# Patient Record
Sex: Female | Born: 1960 | Race: White | Hispanic: No | Marital: Married | Smoking: Former smoker
Health system: Southern US, Community
[De-identification: ages and names within clinical notes are randomized; demographics above are authoritative.]

## PROBLEM LIST (undated history)

## (undated) DIAGNOSIS — E213 Hyperparathyroidism, unspecified: Secondary | ICD-10-CM

## (undated) DIAGNOSIS — M720 Palmar fascial fibromatosis [Dupuytren]: Secondary | ICD-10-CM

## (undated) DIAGNOSIS — M1990 Unspecified osteoarthritis, unspecified site: Secondary | ICD-10-CM

## (undated) DIAGNOSIS — F329 Major depressive disorder, single episode, unspecified: Secondary | ICD-10-CM

## (undated) DIAGNOSIS — M722 Plantar fascial fibromatosis: Secondary | ICD-10-CM

## (undated) DIAGNOSIS — F419 Anxiety disorder, unspecified: Secondary | ICD-10-CM

## (undated) DIAGNOSIS — F32A Depression, unspecified: Secondary | ICD-10-CM

## (undated) DIAGNOSIS — K759 Inflammatory liver disease, unspecified: Secondary | ICD-10-CM

## (undated) HISTORY — DX: Plantar fascial fibromatosis: M72.2

## (undated) HISTORY — DX: Palmar fascial fibromatosis (dupuytren): M72.0

## (undated) HISTORY — PX: BACK SURGERY: SHX140

---

## 2003-03-09 ENCOUNTER — Other Ambulatory Visit: Admission: RE | Admit: 2003-03-09 | Discharge: 2003-03-09 | Payer: Self-pay | Admitting: *Deleted

## 2004-04-13 ENCOUNTER — Other Ambulatory Visit: Admission: RE | Admit: 2004-04-13 | Discharge: 2004-04-13 | Payer: Self-pay | Admitting: *Deleted

## 2005-01-08 ENCOUNTER — Emergency Department (HOSPITAL_COMMUNITY): Admission: EM | Admit: 2005-01-08 | Discharge: 2005-01-08 | Payer: Self-pay | Admitting: Emergency Medicine

## 2006-05-10 ENCOUNTER — Ambulatory Visit (HOSPITAL_COMMUNITY): Admission: RE | Admit: 2006-05-10 | Discharge: 2006-05-11 | Payer: Self-pay | Admitting: Neurosurgery

## 2010-09-25 ENCOUNTER — Emergency Department (HOSPITAL_COMMUNITY): Admission: EM | Admit: 2010-09-25 | Discharge: 2010-09-25 | Payer: Self-pay | Admitting: Emergency Medicine

## 2010-12-30 ENCOUNTER — Encounter: Payer: Self-pay | Admitting: Emergency Medicine

## 2010-12-31 ENCOUNTER — Encounter: Payer: Self-pay | Admitting: *Deleted

## 2011-04-27 NOTE — Op Note (Signed)
Pamela Grimes, Pamela Grimes                 ACCOUNT NO.:  [id number]   MEDICAL RECORD NO.:  [id number]          PATIENT TYPE:  AMB   LOCATION:  SDS                          FACILITY:  [facility]   PHYSICIAN:  Donalee Citrin, M.D.        DATE OF BIRTH:  29-Jun-1961   DATE OF PROCEDURE:  05/10/2006  DATE OF DISCHARGE:                                 OPERATIVE REPORT   PREOPERATIVE DIAGNOSIS:  Lumbar spinal stenosis, lateral recess stenosis,  right side L4 and L5 radiculopathy.   PROCEDURE:  Decompressive lumbar laminectomy L4-5 with microscopic  dissection of the right L4 nerve root and microscopic dissection of the  right L5 nerve root.   SURGEON:  Donalee Citrin, M.D.   ASSISTANT:  Reinaldo Meeker, M.D.   ANESTHESIA:  General endotracheal anesthesia.   HISTORY OF PRESENT ILLNESS:  Patient is a pleasant 50 year old female who  has had longstanding back and prominently right hip and leg pain radiating  down  the back side of her right thigh across her knee to the front of her  shin down to her ankle, previously showed lateral recess stenosis at L4-5  due to pseudoarthropathy, ligamentous hypertrophy and mild disc bulge.  Due  to the patient's failure to conservative treatment with epidural steroid  injections, physical therapy patient was recommended laminectomy  foraminotomy at L4-5 with decompression of both the L4 and L5 nerve roots.  Risks and benefits were explained to the patient, she understands and agrees  to proceed forward.   FINDINGS:  Severe facet arthropathy with dense adhesions to the under  surface of the L4 nerve root.  Disc was not felt to be compressive, was not  bulging and so it was left alone.   DESCRIPTION OF PROCEDURE:  Patient brought to the OR where she was induced  with general anesthesia.  Patient positioned prone on the Wilson frame.  Back was prepped using sterile fashion. Preoperative x-ray localized the L4-  5 disc space.  After infiltration with 10 mL of lidocaine with  epinephrine,  a midline incision was made and Bovie electrocautery was used, taken down to  subcutaneous tissue, subperiosteal dissection carried out on the lamina on  the right side and L4 and L5.  Intraoperative x-ray confirmed position at  appropriate level.  Then using high speed drill, the intravascular lumen of  L4 and medial facet complex and superior aspect of lumen at L5 and spinous  process was drilled down.  Then using a 3 and 4 mm Kerrison punch, virtual  complete laminectomy was performed at L4 and underbitten the under surface  of the medial facet complex and the superior aspect of L5.  A small rim of  lamina of L4 remained.  The medical facet and medial pars was drilled to  gain access to the L4 pedicle and at this point the operating microscope was  draped and brought onto the field for microscopic illumination.  The  hypertrophied ligament was dissected off of the dura and removed in  piecemeal fashion exposing the proximal right L5 nerve root.  The L5  neural  foramina was then opened up and the under surface of the gutter was  underbitten to expose the disc space.  Epidural veins were __________, disc  was inspected, disc was noted to be only very mildly bulging and felt not to  be compressive, it was left alone.  Attention was taken marching up to the  L4 nerve root.  The remainder of the lateral gutter and medial pars was  underbitten to identify and palpate the L4 pedicle.  At this point, the L4  nerve root was identified and foraminotomy was continued along the L4 nerve  root.  This decompressed the L4 nerve root.  The under surface of the root  was explored with a coronary dilator as well as the entire root along the  root the pedicle and distally.  There was no further stenosis after  underbiting the dense medial hypertrophied ligament facet complex.  There  was marked adhesions along the under surface of the ligament and the  superior aspect of the L4 nerve root.   These were all freed up with a #4-0  Penfield and removed in piecemeal fashion decompressing the L4 root.  Then  underneath the L4 root, was explored with a coronary dilator as well as the  disc space.  This was also explored with a hockey stick up underneath the L4  root, the 4 foramen, the 5 foramen, the medial lateral and cephalocaudally  around the disc space and around the decompression site.  Once I was  confident there was no further stenosis, wound was copiously irrigated and  meticulous hemostasis was maintained.  Gelfoam was overlaid on top of the  dura.  The muscle and fascia reapproximated with interrupted Vicryls and the  skin was closed with running 4-0 subcuticular.  Benzoin and Steri-Strips  applied.  Patient went to the recovery room in stable condition.  At the end  of the counts, needle counts were correct.           ______________________________  Donalee Citrin, M.D.     GC/MEDQ  D:  05/10/2006  T:  05/10/2006  Job:  [id number]

## 2012-11-11 ENCOUNTER — Other Ambulatory Visit: Payer: Self-pay | Admitting: Orthopedic Surgery

## 2012-11-11 DIAGNOSIS — M5416 Radiculopathy, lumbar region: Secondary | ICD-10-CM

## 2012-11-11 DIAGNOSIS — M545 Low back pain, unspecified: Secondary | ICD-10-CM

## 2012-11-12 ENCOUNTER — Ambulatory Visit
Admission: RE | Admit: 2012-11-12 | Discharge: 2012-11-12 | Disposition: A | Discharge status: Home / Self Care | Payer: 59 | Source: Ambulatory Visit | Attending: Orthopedic Surgery | Admitting: Orthopedic Surgery

## 2012-11-12 DIAGNOSIS — M545 Low back pain, unspecified: Secondary | ICD-10-CM

## 2012-11-12 DIAGNOSIS — M5416 Radiculopathy, lumbar region: Secondary | ICD-10-CM

## 2012-11-12 MED ORDER — GADOBENATE DIMEGLUMINE 529 MG/ML IV SOLN
16.0000 mL | Freq: Once | INTRAVENOUS | Status: AC | PRN
  Administered 2012-11-12: 16 mL via INTRAVENOUS

## 2013-03-12 ENCOUNTER — Other Ambulatory Visit: Payer: Self-pay | Admitting: Neurosurgery

## 2013-03-30 ENCOUNTER — Encounter (HOSPITAL_COMMUNITY): Payer: Self-pay | Admitting: Pharmacy Technician

## 2013-03-30 NOTE — Pre-Procedure Instructions (Signed)
Pamela Grimes  03/30/2013   Your procedure is scheduled on:  04/13/13  Report to [facility] at 10 AM.  Call this number if you have problems the morning of surgery: [phone]   Remember:   Do not eat food or drink liquids after midnight.   Take these medicines the morning of surgery with A SIP OF WATER: hydrocodone,robaxin   Do not wear jewelry, make-up or nail polish.  Do not wear lotions, powders, or perfumes. You may wear deodorant.  Do not shave 48 hours prior to surgery. Men may shave face and neck.  Do not bring valuables to the hospital.  Contacts, dentures or bridgework may not be worn into surgery.  Leave suitcase in the car. After surgery it may be brought to your room.  For patients admitted to the hospital, checkout time is 11:00 AM the day of  discharge.   Patients discharged the day of surgery will not be allowed to drive  home.  Name and phone number of your driver: family  Special Instructions: Shower using CHG 2 nights before surgery and the night before surgery.  If you shower the day of surgery use CHG.  Use special wash - you have one bottle of CHG for all showers.  You should use approximately 1/3 of the bottle for each shower.   Please read over the following fact sheets that you were given: Pain Booklet, Coughing and Deep Breathing, Blood Transfusion Information, MRSA Information and Surgical Site Infection Prevention

## 2013-03-31 ENCOUNTER — Encounter (HOSPITAL_COMMUNITY): Payer: Self-pay

## 2013-03-31 ENCOUNTER — Encounter (HOSPITAL_COMMUNITY)
Admission: RE | Admit: 2013-03-31 | Discharge: 2013-03-31 | Disposition: A | Discharge status: Home / Self Care | Payer: 59 | Source: Ambulatory Visit | Attending: Neurosurgery | Admitting: Neurosurgery

## 2013-03-31 DIAGNOSIS — Z01812 Encounter for preprocedural laboratory examination: Secondary | ICD-10-CM | POA: Insufficient documentation

## 2013-03-31 HISTORY — DX: Unspecified osteoarthritis, unspecified site: M19.90

## 2013-03-31 LAB — CBC
Hemoglobin: 13.1 g/dL (ref 12.0–15.0)
MCH: 32.8 pg (ref 26.0–34.0)
MCV: 97.2 fL (ref 78.0–100.0)
RBC: 3.99 MIL/uL (ref 3.87–5.11)

## 2013-03-31 LAB — BASIC METABOLIC PANEL
CO2: 24 mEq/L (ref 19–32)
Chloride: 104 mEq/L (ref 96–112)
Glucose, Bld: 93 mg/dL (ref 70–99)
Potassium: 4.4 mEq/L (ref 3.5–5.1)
Sodium: 137 mEq/L (ref 135–145)

## 2013-03-31 LAB — ABO/RH: ABO/RH(D): A POS

## 2013-04-12 MED ORDER — DEXAMETHASONE SODIUM PHOSPHATE 10 MG/ML IJ SOLN
10.0000 mg | INTRAMUSCULAR | Status: DC
  Filled 2013-04-12: qty 1

## 2013-04-12 MED ORDER — CEFAZOLIN SODIUM-DEXTROSE 2-3 GM-% IV SOLR
2.0000 g | INTRAVENOUS | Status: AC
  Administered 2013-04-13: 2 g via INTRAVENOUS
  Filled 2013-04-12: qty 50

## 2013-04-13 ENCOUNTER — Inpatient Hospital Stay (HOSPITAL_COMMUNITY)
Admission: RE | Admit: 2013-04-13 | Discharge: 2013-04-15 | DRG: 460 | Disposition: A | Discharge status: Home / Self Care | Payer: 59 | Source: Ambulatory Visit | Attending: Neurosurgery | Admitting: Neurosurgery

## 2013-04-13 ENCOUNTER — Encounter (HOSPITAL_COMMUNITY)
Admission: RE | Disposition: A | Discharge status: Home / Self Care | Payer: Self-pay | Source: Ambulatory Visit | Attending: Neurosurgery

## 2013-04-13 ENCOUNTER — Encounter (HOSPITAL_COMMUNITY): Payer: Self-pay | Admitting: Anesthesiology

## 2013-04-13 ENCOUNTER — Ambulatory Visit (HOSPITAL_COMMUNITY): Payer: 59 | Admitting: Anesthesiology

## 2013-04-13 ENCOUNTER — Ambulatory Visit (HOSPITAL_COMMUNITY): Payer: 59

## 2013-04-13 DIAGNOSIS — F17200 Nicotine dependence, unspecified, uncomplicated: Secondary | ICD-10-CM | POA: Diagnosis present

## 2013-04-13 DIAGNOSIS — F411 Generalized anxiety disorder: Secondary | ICD-10-CM | POA: Diagnosis present

## 2013-04-13 DIAGNOSIS — Z981 Arthrodesis status: Secondary | ICD-10-CM

## 2013-04-13 DIAGNOSIS — M4310 Spondylolisthesis, site unspecified: Secondary | ICD-10-CM | POA: Diagnosis present

## 2013-04-13 DIAGNOSIS — M129 Arthropathy, unspecified: Secondary | ICD-10-CM | POA: Diagnosis present

## 2013-04-13 DIAGNOSIS — M5126 Other intervertebral disc displacement, lumbar region: Principal | ICD-10-CM | POA: Diagnosis present

## 2013-04-13 HISTORY — DX: Anxiety disorder, unspecified: F41.9

## 2013-04-13 LAB — CK TOTAL AND CKMB (NOT AT ARMC)
CK, MB: 2.8 ng/mL (ref 0.3–4.0)
Relative Index: 2.6 — ABNORMAL HIGH (ref 0.0–2.5)
Total CK: 107 U/L (ref 7–177)

## 2013-04-13 SURGERY — POSTERIOR LUMBAR FUSION 1 LEVEL
Anesthesia: General | Site: Back | Wound class: Clean

## 2013-04-13 MED ORDER — DIPHENHYDRAMINE HCL 12.5 MG/5ML PO ELIX
12.5000 mg | ORAL_SOLUTION | Freq: Four times a day (QID) | ORAL | Status: DC | PRN
  Filled 2013-04-13: qty 5

## 2013-04-13 MED ORDER — LACTATED RINGERS IV SOLN
INTRAVENOUS | Status: DC | PRN
  Administered 2013-04-13 (×3): via INTRAVENOUS

## 2013-04-13 MED ORDER — HYDROMORPHONE 0.3 MG/ML IV SOLN
INTRAVENOUS | Status: AC
  Administered 2013-04-13: 14:00:00
  Filled 2013-04-13: qty 25

## 2013-04-13 MED ORDER — HYDROMORPHONE 0.3 MG/ML IV SOLN
INTRAVENOUS | Status: DC
  Administered 2013-04-13: 2.4 mg via INTRAVENOUS
  Administered 2013-04-13: 21:00:00 via INTRAVENOUS
  Administered 2013-04-13: 3.9 mg via INTRAVENOUS
  Administered 2013-04-13: 2.68 mg via INTRAVENOUS
  Administered 2013-04-14: 7.77 mg via INTRAVENOUS
  Administered 2013-04-14: 05:00:00 via INTRAVENOUS
  Administered 2013-04-14: 3.3 mg via INTRAVENOUS
  Filled 2013-04-13 (×2): qty 25

## 2013-04-13 MED ORDER — MENTHOL 3 MG MT LOZG: 1.0000 | LOZENGE | OROMUCOSAL | Status: DC | PRN

## 2013-04-13 MED ORDER — MEPERIDINE HCL 25 MG/ML IJ SOLN: 6.2500 mg | INTRAMUSCULAR | Status: DC | PRN

## 2013-04-13 MED ORDER — VECURONIUM BROMIDE 10 MG IV SOLR
INTRAVENOUS | Status: DC | PRN
  Administered 2013-04-13: 2 mg via INTRAVENOUS
  Administered 2013-04-13: 1 mg via INTRAVENOUS
  Administered 2013-04-13: 1.5 mg via INTRAVENOUS
  Administered 2013-04-13: 2 mg via INTRAVENOUS
  Administered 2013-04-13: 1 mg via INTRAVENOUS

## 2013-04-13 MED ORDER — PHENYLEPHRINE HCL 10 MG/ML IJ SOLN
10.0000 mg | INTRAVENOUS | Status: DC | PRN
  Administered 2013-04-13: 10 ug/min via INTRAVENOUS

## 2013-04-13 MED ORDER — ONDANSETRON HCL 4 MG/2ML IJ SOLN
INTRAMUSCULAR | Status: DC | PRN
  Administered 2013-04-13: 4 mg via INTRAVENOUS

## 2013-04-13 MED ORDER — SODIUM CHLORIDE 0.9 % IJ SOLN: 9.0000 mL | INTRAMUSCULAR | Status: DC | PRN

## 2013-04-13 MED ORDER — SODIUM CHLORIDE 0.9 % IJ SOLN: 3.0000 mL | INTRAMUSCULAR | Status: DC | PRN

## 2013-04-13 MED ORDER — THROMBIN 20000 UNITS EX KIT
PACK | CUTANEOUS | Status: DC | PRN
  Administered 2013-04-13: 20000 [IU] via TOPICAL

## 2013-04-13 MED ORDER — ONDANSETRON HCL 4 MG/2ML IJ SOLN: 4.0000 mg | Freq: Four times a day (QID) | INTRAMUSCULAR | Status: DC | PRN

## 2013-04-13 MED ORDER — OXYCODONE HCL 5 MG PO TABS
ORAL_TABLET | ORAL | Status: AC
  Filled 2013-04-13: qty 1

## 2013-04-13 MED ORDER — HYDROCODONE-ACETAMINOPHEN 5-325 MG PO TABS
1.0000 | ORAL_TABLET | Freq: Four times a day (QID) | ORAL | Status: DC | PRN
  Administered 2013-04-15 (×2): 1 via ORAL
  Filled 2013-04-13 (×2): qty 1

## 2013-04-13 MED ORDER — LIDOCAINE HCL (CARDIAC) 20 MG/ML IV SOLN
INTRAVENOUS | Status: DC | PRN
  Administered 2013-04-13: 100 mg via INTRAVENOUS
  Administered 2013-04-13: 50 mg via INTRAVENOUS

## 2013-04-13 MED ORDER — METHOCARBAMOL 500 MG PO TABS
500.0000 mg | ORAL_TABLET | Freq: Every day | ORAL | Status: DC
  Administered 2013-04-13 – 2013-04-15 (×3): 500 mg via ORAL
  Filled 2013-04-13 (×3): qty 1

## 2013-04-13 MED ORDER — CYCLOBENZAPRINE HCL 10 MG PO TABS
ORAL_TABLET | ORAL | Status: AC
  Filled 2013-04-13: qty 1

## 2013-04-13 MED ORDER — PHENOL 1.4 % MT LIQD: 1.0000 | OROMUCOSAL | Status: DC | PRN

## 2013-04-13 MED ORDER — HYDROMORPHONE HCL PF 1 MG/ML IJ SOLN
0.5000 mg | INTRAMUSCULAR | Status: DC | PRN
  Filled 2013-04-13 (×3): qty 1

## 2013-04-13 MED ORDER — BACITRACIN 50000 UNITS IM SOLR
INTRAMUSCULAR | Status: AC
  Filled 2013-04-13: qty 1

## 2013-04-13 MED ORDER — BUPIVACAINE HCL (PF) 0.25 % IJ SOLN
INTRAMUSCULAR | Status: DC | PRN
  Administered 2013-04-13: 10 mL

## 2013-04-13 MED ORDER — DOCUSATE SODIUM 100 MG PO CAPS
100.0000 mg | ORAL_CAPSULE | Freq: Two times a day (BID) | ORAL | Status: DC
  Administered 2013-04-13 – 2013-04-15 (×3): 100 mg via ORAL
  Filled 2013-04-13 (×5): qty 1

## 2013-04-13 MED ORDER — CEFAZOLIN SODIUM 1-5 GM-% IV SOLN
1.0000 g | Freq: Three times a day (TID) | INTRAVENOUS | Status: AC
  Administered 2013-04-13 – 2013-04-14 (×2): 1 g via INTRAVENOUS
  Filled 2013-04-13 (×2): qty 50

## 2013-04-13 MED ORDER — LIDOCAINE HCL 4 % MT SOLN
OROMUCOSAL | Status: DC | PRN
  Administered 2013-04-13: 4 mL via TOPICAL

## 2013-04-13 MED ORDER — SODIUM CHLORIDE 0.9 % IV SOLN
INTRAVENOUS | Status: AC
  Filled 2013-04-13: qty 500

## 2013-04-13 MED ORDER — LIDOCAINE-EPINEPHRINE 1 %-1:100000 IJ SOLN
INTRAMUSCULAR | Status: DC | PRN
  Administered 2013-04-13: 9 mL via INTRADERMAL

## 2013-04-13 MED ORDER — ACETAMINOPHEN 10 MG/ML IV SOLN
1000.0000 mg | Freq: Once | INTRAVENOUS | Status: AC
  Administered 2013-04-13: 1000 mg via INTRAVENOUS

## 2013-04-13 MED ORDER — HYDROMORPHONE HCL PF 1 MG/ML IJ SOLN
INTRAMUSCULAR | Status: AC
  Filled 2013-04-13: qty 1

## 2013-04-13 MED ORDER — 0.9 % SODIUM CHLORIDE (POUR BTL) OPTIME
TOPICAL | Status: DC | PRN
  Administered 2013-04-13: 1000 mL

## 2013-04-13 MED ORDER — OXYCODONE HCL 5 MG/5ML PO SOLN: 5.0000 mg | Freq: Once | ORAL | Status: AC | PRN

## 2013-04-13 MED ORDER — ACETAMINOPHEN 10 MG/ML IV SOLN
INTRAVENOUS | Status: AC
  Filled 2013-04-13: qty 100

## 2013-04-13 MED ORDER — SODIUM CHLORIDE 0.9 % IJ SOLN
3.0000 mL | Freq: Two times a day (BID) | INTRAMUSCULAR | Status: DC
  Administered 2013-04-13 – 2013-04-15 (×3): 3 mL via INTRAVENOUS

## 2013-04-13 MED ORDER — OXYCODONE HCL 5 MG PO TABS
5.0000 mg | ORAL_TABLET | ORAL | Status: DC | PRN
  Administered 2013-04-13 – 2013-04-15 (×6): 5 mg via ORAL
  Filled 2013-04-13 (×9): qty 1

## 2013-04-13 MED ORDER — SODIUM CHLORIDE 0.9 % IR SOLN
Status: DC | PRN
  Administered 2013-04-13: 10:00:00

## 2013-04-13 MED ORDER — DIPHENHYDRAMINE HCL 50 MG/ML IJ SOLN: 12.5000 mg | Freq: Four times a day (QID) | INTRAMUSCULAR | Status: DC | PRN

## 2013-04-13 MED ORDER — FAMOTIDINE 10 MG PO TABS
10.0000 mg | ORAL_TABLET | Freq: Every day | ORAL | Status: DC
  Administered 2013-04-13 – 2013-04-15 (×3): 10 mg via ORAL
  Filled 2013-04-13 (×3): qty 1

## 2013-04-13 MED ORDER — ONDANSETRON HCL 4 MG/2ML IJ SOLN: 4.0000 mg | Freq: Once | INTRAMUSCULAR | Status: DC | PRN

## 2013-04-13 MED ORDER — SODIUM CHLORIDE 0.9 % IV SOLN
250.0000 mL | INTRAVENOUS | Status: DC
  Administered 2013-04-13: 250 mL via INTRAVENOUS

## 2013-04-13 MED ORDER — HYDROMORPHONE HCL PF 1 MG/ML IJ SOLN
0.2500 mg | INTRAMUSCULAR | Status: DC | PRN
  Administered 2013-04-13 (×4): 0.5 mg via INTRAVENOUS

## 2013-04-13 MED ORDER — NALOXONE HCL 0.4 MG/ML IJ SOLN: 0.4000 mg | INTRAMUSCULAR | Status: DC | PRN

## 2013-04-13 MED ORDER — DIAZEPAM 5 MG PO TABS
10.0000 mg | ORAL_TABLET | Freq: Every evening | ORAL | Status: DC | PRN
  Administered 2013-04-14: 10 mg via ORAL
  Filled 2013-04-13 (×2): qty 2

## 2013-04-13 MED ORDER — OXYCODONE-ACETAMINOPHEN 10-325 MG PO TABS: 1.0000 | ORAL_TABLET | ORAL | Status: DC | PRN

## 2013-04-13 MED ORDER — NEOSTIGMINE METHYLSULFATE 1 MG/ML IJ SOLN
INTRAMUSCULAR | Status: DC | PRN
  Administered 2013-04-13: 3 mg via INTRAVENOUS

## 2013-04-13 MED ORDER — ARTIFICIAL TEARS OP OINT
TOPICAL_OINTMENT | OPHTHALMIC | Status: DC | PRN
  Administered 2013-04-13: 1 via OPHTHALMIC

## 2013-04-13 MED ORDER — PROPOFOL 10 MG/ML IV BOLUS
INTRAVENOUS | Status: DC | PRN
  Administered 2013-04-13: 120 mg via INTRAVENOUS

## 2013-04-13 MED ORDER — MIDAZOLAM HCL 5 MG/5ML IJ SOLN
INTRAMUSCULAR | Status: DC | PRN
  Administered 2013-04-13: 2 mg via INTRAVENOUS

## 2013-04-13 MED ORDER — GLYCOPYRROLATE 0.2 MG/ML IJ SOLN
INTRAMUSCULAR | Status: DC | PRN
  Administered 2013-04-13 (×2): 0.1 mg via INTRAVENOUS
  Administered 2013-04-13: 0.4 mg via INTRAVENOUS
  Administered 2013-04-13 (×2): 0.1 mg via INTRAVENOUS

## 2013-04-13 MED ORDER — FENTANYL CITRATE 0.05 MG/ML IJ SOLN
INTRAMUSCULAR | Status: DC | PRN
  Administered 2013-04-13: 100 ug via INTRAVENOUS
  Administered 2013-04-13: 50 ug via INTRAVENOUS
  Administered 2013-04-13: 100 ug via INTRAVENOUS
  Administered 2013-04-13: 50 ug via INTRAVENOUS
  Administered 2013-04-13: 100 ug via INTRAVENOUS
  Administered 2013-04-13: 50 ug via INTRAVENOUS
  Administered 2013-04-13: 100 ug via INTRAVENOUS
  Administered 2013-04-13 (×4): 50 ug via INTRAVENOUS

## 2013-04-13 MED ORDER — PHENYLEPHRINE HCL 10 MG/ML IJ SOLN
INTRAMUSCULAR | Status: DC | PRN
  Administered 2013-04-13: 80 ug via INTRAVENOUS
  Administered 2013-04-13: 40 ug via INTRAVENOUS
  Administered 2013-04-13: 80 ug via INTRAVENOUS

## 2013-04-13 MED ORDER — ACETAMINOPHEN 10 MG/ML IV SOLN
1000.0000 mg | Freq: Four times a day (QID) | INTRAVENOUS | Status: AC
  Administered 2013-04-13 – 2013-04-14 (×4): 1000 mg via INTRAVENOUS
  Filled 2013-04-13 (×4): qty 100

## 2013-04-13 MED ORDER — ROCURONIUM BROMIDE 100 MG/10ML IV SOLN
INTRAVENOUS | Status: DC | PRN
  Administered 2013-04-13: 50 mg via INTRAVENOUS

## 2013-04-13 MED ORDER — OXYCODONE HCL 5 MG PO TABS
5.0000 mg | ORAL_TABLET | Freq: Once | ORAL | Status: AC | PRN
  Administered 2013-04-13: 5 mg via ORAL

## 2013-04-13 MED ORDER — CYCLOBENZAPRINE HCL 10 MG PO TABS
10.0000 mg | ORAL_TABLET | Freq: Three times a day (TID) | ORAL | Status: DC | PRN
  Administered 2013-04-13 – 2013-04-15 (×5): 10 mg via ORAL
  Filled 2013-04-13 (×4): qty 1

## 2013-04-13 MED ORDER — HEMOSTATIC AGENTS (NO CHARGE) OPTIME
TOPICAL | Status: DC | PRN
  Administered 2013-04-13: 1 via TOPICAL

## 2013-04-13 MED ORDER — ONDANSETRON HCL 4 MG/2ML IJ SOLN: 4.0000 mg | INTRAMUSCULAR | Status: DC | PRN

## 2013-04-13 MED FILL — Sodium Chloride Irrigation Soln 0.9%: Qty: 3000 | Status: AC

## 2013-04-13 MED FILL — Heparin Sodium (Porcine) Inj 1000 Unit/ML: INTRAMUSCULAR | Qty: 30 | Status: AC

## 2013-04-13 MED FILL — Sodium Chloride IV Soln 0.9%: INTRAVENOUS | Qty: 1000 | Status: AC

## 2013-04-13 SURGICAL SUPPLY — 75 items
ADH SKN CLS APL DERMABOND .7 (GAUZE/BANDAGES/DRESSINGS) ×1
APL SKNCLS STERI-STRIP NONHPOA (GAUZE/BANDAGES/DRESSINGS) ×1
BAG DECANTER FOR FLEXI CONT (MISCELLANEOUS) ×2 IMPLANT
BENZOIN TINCTURE PRP APPL 2/3 (GAUZE/BANDAGES/DRESSINGS) ×2 IMPLANT
BLADE SURG 11 STRL SS (BLADE) ×2 IMPLANT
BLADE SURG ROTATE 9660 (MISCELLANEOUS) IMPLANT
BRUSH SCRUB EZ PLAIN DRY (MISCELLANEOUS) ×2 IMPLANT
BUR MATCHSTICK NEURO 3.0 LAGG (BURR) ×2 IMPLANT
BUR PRECISION FLUTE 6.0 (BURR) ×2 IMPLANT
CANISTER SUCTION 2500CC (MISCELLANEOUS) ×2 IMPLANT
CAP LOCKING REVERE (Cap) ×4 IMPLANT
CLOTH BEACON ORANGE TIMEOUT ST (SAFETY) ×2 IMPLANT
CONT SPEC 4OZ CLIKSEAL STRL BL (MISCELLANEOUS) ×4 IMPLANT
COVER BACK TABLE 24X17X13 BIG (DRAPES) IMPLANT
COVER TABLE BACK 60X90 (DRAPES) ×2 IMPLANT
DECANTER SPIKE VIAL GLASS SM (MISCELLANEOUS) ×2 IMPLANT
DERMABOND ADVANCED (GAUZE/BANDAGES/DRESSINGS) ×1
DERMABOND ADVANCED .7 DNX12 (GAUZE/BANDAGES/DRESSINGS) ×1 IMPLANT
DRAPE C-ARM 42X72 X-RAY (DRAPES) ×4 IMPLANT
DRAPE LAPAROTOMY 100X72X124 (DRAPES) ×2 IMPLANT
DRAPE POUCH INSTRU U-SHP 10X18 (DRAPES) ×2 IMPLANT
DRAPE PROXIMA HALF (DRAPES) IMPLANT
DRAPE SURG 17X23 STRL (DRAPES) ×2 IMPLANT
DRESSING TELFA 8X3 (GAUZE/BANDAGES/DRESSINGS) ×1 IMPLANT
DRSG OPSITE 4X5.5 SM (GAUZE/BANDAGES/DRESSINGS) ×3 IMPLANT
DURAPREP 26ML APPLICATOR (WOUND CARE) ×2 IMPLANT
ELECT REM PT RETURN 9FT ADLT (ELECTROSURGICAL) ×2
ELECTRODE REM PT RTRN 9FT ADLT (ELECTROSURGICAL) ×1 IMPLANT
EVACUATOR 3/16  PVC DRAIN (DRAIN) ×1
EVACUATOR 3/16 PVC DRAIN (DRAIN) ×1 IMPLANT
GAUZE SPONGE 4X4 16PLY XRAY LF (GAUZE/BANDAGES/DRESSINGS) ×1 IMPLANT
GLOVE BIO SURGEON STRL SZ8 (GLOVE) ×4 IMPLANT
GLOVE BIOGEL PI IND STRL 6.5 (GLOVE) IMPLANT
GLOVE BIOGEL PI IND STRL 7.0 (GLOVE) IMPLANT
GLOVE BIOGEL PI INDICATOR 6.5 (GLOVE) ×2
GLOVE BIOGEL PI INDICATOR 7.0 (GLOVE) ×2
GLOVE ECLIPSE 7.5 STRL STRAW (GLOVE) IMPLANT
GLOVE EXAM NITRILE LRG STRL (GLOVE) IMPLANT
GLOVE EXAM NITRILE MD LF STRL (GLOVE) ×2 IMPLANT
GLOVE EXAM NITRILE XL STR (GLOVE) IMPLANT
GLOVE EXAM NITRILE XS STR PU (GLOVE) IMPLANT
GLOVE INDICATOR 8.5 STRL (GLOVE) ×4 IMPLANT
GLOVE SURG SS PI 6.5 STRL IVOR (GLOVE) ×4 IMPLANT
GLOVE SURG SS PI 7.0 STRL IVOR (GLOVE) ×5 IMPLANT
GOWN BRE IMP SLV AUR LG STRL (GOWN DISPOSABLE) ×1 IMPLANT
GOWN BRE IMP SLV AUR XL STRL (GOWN DISPOSABLE) ×8 IMPLANT
GOWN STRL REIN 2XL LVL4 (GOWN DISPOSABLE) ×1 IMPLANT
KIT BASIN OR (CUSTOM PROCEDURE TRAY) ×2 IMPLANT
KIT ROOM TURNOVER OR (KITS) ×2 IMPLANT
MILL MEDIUM DISP (BLADE) ×1 IMPLANT
NDL HYPO 25X1 1.5 SAFETY (NEEDLE) ×1 IMPLANT
NEEDLE HYPO 25X1 1.5 SAFETY (NEEDLE) ×2 IMPLANT
NS IRRIG 1000ML POUR BTL (IV SOLUTION) ×2 IMPLANT
PACK LAMINECTOMY NEURO (CUSTOM PROCEDURE TRAY) ×2 IMPLANT
PAD ARMBOARD 7.5X6 YLW CONV (MISCELLANEOUS) ×6 IMPLANT
PUTTY BONE DBX 5CC MIX (Putty) ×1 IMPLANT
ROD CURVED 5.5X45MM (Rod) ×2 IMPLANT
SCREW PEDICLE 6.5MMX45MM (Screw) ×4 IMPLANT
SCREW PEDICLE 6.5X40MM (Screw) ×1 IMPLANT
SCREW PEDICLE 6.5X45 (Screw) IMPLANT
SCREW PEDICLE REVERE 5.5X45 (Screw) ×1 IMPLANT
SPACER CALIBER 10X26MM 11-15MM (Spacer) ×2 IMPLANT
SPONGE GAUZE 4X4 12PLY (GAUZE/BANDAGES/DRESSINGS) ×2 IMPLANT
SPONGE LAP 4X18 X RAY DECT (DISPOSABLE) IMPLANT
SPONGE SURGIFOAM ABS GEL 100 (HEMOSTASIS) ×2 IMPLANT
STRIP CLOSURE SKIN 1/2X4 (GAUZE/BANDAGES/DRESSINGS) ×4 IMPLANT
SUT VIC AB 0 CT1 18XCR BRD8 (SUTURE) ×2 IMPLANT
SUT VIC AB 0 CT1 8-18 (SUTURE) ×4
SUT VIC AB 2-0 CT1 18 (SUTURE) ×2 IMPLANT
SUT VICRYL 4-0 PS2 18IN ABS (SUTURE) ×2 IMPLANT
SYR 20ML ECCENTRIC (SYRINGE) ×2 IMPLANT
TOWEL OR 17X24 6PK STRL BLUE (TOWEL DISPOSABLE) ×2 IMPLANT
TOWEL OR 17X26 10 PK STRL BLUE (TOWEL DISPOSABLE) ×2 IMPLANT
TRAY FOLEY CATH 14FRSI W/METER (CATHETERS) ×2 IMPLANT
WATER STERILE IRR 1000ML POUR (IV SOLUTION) ×2 IMPLANT

## 2013-04-13 NOTE — Transfer of Care (Signed)
Immediate Anesthesia Transfer of Care Note  Patient: Pamela Grimes  Procedure(s) Performed: Procedure(s) with comments: POSTERIOR LUMBAR FUSION 1 LEVEL (N/A) - Posterior Lumbar Fusion Lumbar Four-Five   Patient Location: PACU  Anesthesia Type:General  Level of Consciousness: awake, alert  and oriented  Airway & Oxygen Therapy: Patient Spontanous Breathing and Patient connected to nasal cannula oxygen  Post-op Assessment: Report given to PACU RN, Post -op Vital signs reviewed and stable and Patient moving all extremities X 4  Post vital signs: Reviewed and stable  Complications: No apparent anesthesia complications

## 2013-04-13 NOTE — Op Note (Signed)
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 with severe lumbar spinal stenosis and severe foraminal stenosis of the L4 and L5 nerve roots bilaterally  Postoperative diagnosis: Same  Procedure: #1 redo- decompressive lumbar laminectomy L4-5 requiring more work than would be required with a standard interbody fusion with a radical facetectomies and radical decompression of the L4 and L5 nerve roots bilaterally  #2 posterior lumbar interbody fusion L4-5 using globus caliber expandable peek cages packed with local autograft mixed DBX  #3 pedicle screw fixation using the 5.5 globus Revere pedicle to system L4-5  #4 posterior lateral arthrodesis L4-L5 using local autograft mixed DBX  #5 open reduction spinal deformity L4-5  #6 placement of a large Hemovac drain  Surgeon: Jillyn Hidden Anyelina Claycomb  Assistant: Sherilyn Cooter pool  Anesthesia: Gen.  EBL: Minimal  History of present illness: Patient is a very pleasant 52 year old female who undergone previous laminectomy discectomy on the right at L4-5 initially did very well however over last year to has had progressive decline and worsening back pain and probably left leg pain consistent with an L5 nerve root pattern workup with plain films and MRI scan showed a degenerative spondylolisthesis with severe foraminal stenosis of the L4 and L5 nerve roots marked facet arthropathy. She failed all forms of conservative treatment and I recommended decompression stabilization procedure at L4-5 excess will the wrist and benefits of the operation with her as well as perioperative course and expectations of outcome alternatives of surgery she understood and agreed to proceed forward.  Operative procedure: Patient brought into the or was induced under general anesthesia positioned prone the Wilson frame her back was prepped and draped in routine sterile fashion. After infiltration 10 cc lidocaine with epi her old incision was opened up and extended slightly  rostral caudally the scar tissues dissected free and subperiosteal dissections care lamina of L4 and L5 bilaterally exposing the TPS at L4 and L5 bilaterally. Interoperative X. identify the appropriate level so the spinous process of L4 was removed central decompression was begun and after complete central decompression was completed attention was taken in dissecting the scar away from the laminotomy defect in the right L4-5 with dental dissectors and Penfield lumbar scar was removed the proximal L5 nerve root this allowed access identification of the L5 pedicle there was marked facet arthropathy of both facets but especially on the right and severe degenerative spur displacing the undersurface of the L4 nerve root this necessitated complete facetectomy incomplete removal of the pars at this level this allowed decompression of the L4 and L5 nerve root and the right this procedure was completed on the left again with marked facet arthropathy and a lot of extensive spurring going up into the undersurface of the 4 root aggressive abutting of the superior tickling process of L5 allowed lateral access to the space. After adequate decompression achieved attention second pedicle screw placement using a high-speed drill pilot holes were drilled pedicles were cannulated the awl probed O55 Probed again and 05/14/1944 screws were inserted at L4 on the left 6 x 40 L5 in the left during the placement of a 6 x 45 at L4 the right the super aspect of the facet complex started cracking site medially stop before it extended and the pedicle and placed a 5 5 x 45 screw in the right and the pedicle remained intact with no evidence of breech. Then the right-sided screws placed all pedicles were probed within the canal as well as within the pedicle and using bony landmarks and fluoroscopy  no medial or lateral breech was obtained. Then the disc space was incised bilaterally a size 10 distractor was initially started on the right then this  was in March of 211 left the spaces and cleanout from the right preparing the endplates an 11 cage was expanded up to approximately 12-13 mm this significantly reduced the deformity L4-5 and the distractor was removed the spaces cleanout the left both centrally and laterally local autograft was then packed centrally and a left-sided cages placed and expanded and a similar fashion. This again to further reduce the deformity the hand open up the foramen of the L4 and L5 nerve roots. Then the was copious irrigated aggressive decortication was carried TPS lateral gutters local autograft was packed posterior laterally then the rods were attached top tightness tightened down and screw construct and felt to be solid foraminal reexplored and Gelfoam was laid over the dura Hemovac was placed and the wounds closed in layers with after Vicryl the skin was closed running 4 subcuticular benzoin Steri-Strips applied patient recovered in stable condition. At the case all needle counts and sponge counts were correct.

## 2013-04-13 NOTE — Anesthesia Preprocedure Evaluation (Addendum)
Anesthesia Evaluation  Patient identified by MRN, date of birth, ID band Patient awake    Reviewed: Allergy & Precautions, H&P , NPO status , Patient's Chart, lab work & pertinent test results  History of Anesthesia Complications Negative for: history of anesthetic complications  Airway Mallampati: II TM Distance: >3 FB Neck ROM: Full    Dental  (+) Dental Advisory Given and Teeth Intact   Pulmonary Current Smoker,          Cardiovascular negative cardio ROS      Neuro/Psych Anxiety negative neurological ROS     GI/Hepatic GERD-  Medicated and Controlled,  Endo/Other  negative endocrine ROS  Renal/GU      Musculoskeletal  (+) Arthritis -,   Abdominal   Peds  Hematology   Anesthesia Other Findings   Reproductive/Obstetrics                          Anesthesia Physical Anesthesia Plan  ASA: II  Anesthesia Plan: General   Post-op Pain Management:    Induction: Intravenous  Airway Management Planned: Oral ETT  Additional Equipment:   Intra-op Plan:   Post-operative Plan: Extubation in OR  Informed Consent: I have reviewed the patients History and Physical, chart, labs and discussed the procedure including the risks, benefits and alternatives for the proposed anesthesia with the patient or authorized representative who has indicated his/her understanding and acceptance.   Dental advisory given  Plan Discussed with: Surgeon and CRNA  Anesthesia Plan Comments:        Anesthesia Quick Evaluation

## 2013-04-13 NOTE — Anesthesia Postprocedure Evaluation (Signed)
Anesthesia Post Note  Patient: Pamela Grimes  Procedure(s) Performed: Procedure(s) (LRB): POSTERIOR LUMBAR FUSION 1 LEVEL (N/A)  Anesthesia type: general  Patient location: PACU  Post pain: Pain level controlled  Post assessment: Patient's Cardiovascular Status Stable  Last Vitals:  Filed Vitals:   04/13/13 1600  BP: 99/64  Pulse: 89  Temp:   Resp: 11    Post vital signs: Reviewed and stable  Level of consciousness: sedated  Complications: No apparent anesthesia complications

## 2013-04-13 NOTE — Anesthesia Procedure Notes (Signed)
Procedure Name: Intubation Date/Time: 04/13/2013 9:55 AM Performed by: Gayla Medicus Pre-anesthesia Checklist: Timeout performed, Patient identified, Suction available, Patient being monitored and Emergency Drugs available Patient Re-evaluated:Patient Re-evaluated prior to inductionOxygen Delivery Method: Circle system utilized Preoxygenation: Pre-oxygenation with 100% oxygen Intubation Type: IV induction Ventilation: Mask ventilation without difficulty Laryngoscope Size: Mac and 3 Grade View: Grade I Tube type: Oral Tube size: 7.0 mm Number of attempts: 1 Airway Equipment and Method: Stylet and LTA kit utilized Placement Confirmation: ETT inserted through vocal cords under direct vision,  positive ETCO2 and breath sounds checked- equal and bilateral Secured at: 21 cm Tube secured with: Tape Dental Injury: Teeth and Oropharynx as per pre-operative assessment

## 2013-04-13 NOTE — Plan of Care (Signed)
Problem: Consults Goal: Diagnosis - Spinal Surgery Outcome: Completed/Met Date Met:  04/13/13 Lumbar fusion

## 2013-04-13 NOTE — H&P (Signed)
Pamela Grimes is an 52 y.o. female.   Chief Complaint: Back and left leg HPI: Patient is a very pleasant 52 year old female undergone previous discectomy many years ago and did very well however last several weeks and months is a progress worsening back and left leg pain rating unchanged fusion as her previous radiculopathy L4-5 and L5 nerve root pattern. Workup has shown a degenerative spondylolisthesis and severe foraminal stenosis and recurrent disc herniation L4-5 into her progression of clinical syndrome failed conservative treatment with therapy steroid injections and time I have recommended a redo laminectomy and interbody fusion L4-5 his wound over the risks and benefits of the operation with her as well as perioperative course expectations about alternatives of surgery she understands and agrees to proceed forward.  Past Medical History  Diagnosis Date  . Arthritis   . Anxiety     Past Surgical History  Procedure Laterality Date  . Cesarean section    . Back surgery      History reviewed. No pertinent family history. Social History:  reports that she has been smoking Cigarettes.  She has a 15 pack-year smoking history. She does not have any smokeless tobacco history on file. She reports that  drinks alcohol. She reports that she does not use illicit drugs.  Allergies: No Known Allergies  Medications Prior to Admission  Medication Sig Dispense Refill  . diazepam (VALIUM) 10 MG tablet Take 10 mg by mouth at bedtime as needed for anxiety.      Marland Kitchen ibuprofen (ADVIL,MOTRIN) 200 MG tablet Take 800 mg by mouth 4 (four) times daily as needed for pain.      . methocarbamol (ROBAXIN) 500 MG tablet Take 500 mg by mouth daily.      Marland Kitchen oxyCODONE-acetaminophen (PERCOCET) 10-325 MG per tablet Take 1 tablet by mouth every 6 (six) hours as needed for pain (1-1 Q4-6h prn).      . ranitidine (ZANTAC) 150 MG capsule Take 150 mg by mouth as needed for heartburn.      Marland Kitchen CALCIUM PO Take 1 tablet by mouth  daily.      . Cholecalciferol (VITAMIN D PO) Take 1 tablet by mouth daily.      . Cyanocobalamin (B-12 PO) Take 1 tablet by mouth daily.      Marland Kitchen HYDROcodone-acetaminophen (NORCO/VICODIN) 5-325 MG per tablet Take 1 tablet by mouth every 6 (six) hours as needed for pain.        No results found for this or any previous visit (from the past 48 hour(s)). No results found.  Review of Systems  Constitutional: Negative.   HENT: Negative.   Respiratory: Negative.   Cardiovascular: Negative.   Gastrointestinal: Negative.   Genitourinary: Negative.   Musculoskeletal: Positive for myalgias, back pain and joint pain.  Skin: Negative.   Neurological: Positive for sensory change.  Psychiatric/Behavioral: Negative.     Blood pressure 123/80, pulse 79, temperature 98.4 F (36.9 C), temperature source Oral, resp. rate 20, SpO2 93.00%. Physical Exam  Constitutional: She is oriented to person, place, and time. She appears well-developed and well-nourished.  Eyes: Pupils are equal, round, and reactive to light.  Neck: Normal range of motion.  Cardiovascular: Normal rate.   Respiratory: Effort normal.  GI: Soft.  Neurological: She is alert and oriented to person, place, and time. She has normal strength. GCS eye subscore is 4. GCS verbal subscore is 5. GCS motor subscore is 6.  Reflex Scores:      Brachioradialis reflexes are 1+ on the  right side and 1+ on the left side.      Patellar reflexes are 1+ on the right side and 1+ on the left side.      Achilles reflexes are 0 on the right side and 0 on the left side. Strength is 5 out of 5 in her iliopsoas, quads, hamstrings, gastrocs, anterior tibialis, and EHL.  Skin: Skin is warm.     Assessment/Plan 24 or female presents for redo laminectomy L4-5 and fusion.  Romeo Zielinski P 04/13/2013, 9:33 AM

## 2013-04-13 NOTE — Preoperative (Signed)
Beta Blockers   Reason not to administer Beta Blockers:Not Applicable 

## 2013-04-13 NOTE — Progress Notes (Signed)
Dr. Wynetta Emery in to see pt , told what she had for pain, order for PCA obtained

## 2013-04-14 MED ORDER — HYDROMORPHONE HCL PF 1 MG/ML IJ SOLN
1.0000 mg | INTRAMUSCULAR | Status: DC | PRN
Start: 2013-04-14 — End: 2013-04-15
  Administered 2013-04-14 – 2013-04-15 (×8): 1 mg via INTRAVENOUS
  Filled 2013-04-14 (×5): qty 1

## 2013-04-14 NOTE — Progress Notes (Signed)
UR completed 

## 2013-04-14 NOTE — Progress Notes (Signed)
Patient ID: Pamela Grimes, female   DOB: [birth year], 52 y.o.   MRN: [id number] Patient is doing well no leg pain back is manageable her strength is 5 of 5 in her lower extremities in her wound is clean and dry

## 2013-04-15 MED ORDER — BUTALBITAL-APAP-CAFFEINE 50-325-40 MG PO TABS
2.0000 | ORAL_TABLET | ORAL | Status: DC | PRN
  Administered 2013-04-15 (×2): 2 via ORAL
  Filled 2013-04-15 (×2): qty 2

## 2013-04-15 MED ORDER — DEXAMETHASONE SODIUM PHOSPHATE 10 MG/ML IJ SOLN
10.0000 mg | Freq: Four times a day (QID) | INTRAMUSCULAR | Status: AC
  Administered 2013-04-15 (×2): 10 mg via INTRAVENOUS
  Filled 2013-04-15 (×2): qty 1

## 2013-04-15 MED ORDER — OXYCODONE HCL 5 MG PO TABS: 15.0000 mg | ORAL_TABLET | ORAL | Status: DC | PRN

## 2013-04-15 MED ORDER — PANTOPRAZOLE SODIUM 40 MG PO TBEC
40.0000 mg | DELAYED_RELEASE_TABLET | Freq: Every day | ORAL | Status: DC
Start: 2013-04-15 — End: 2013-04-15
  Administered 2013-04-15: 40 mg via ORAL

## 2013-04-15 MED ORDER — CYCLOBENZAPRINE HCL 10 MG PO TABS: 10.0000 mg | ORAL_TABLET | Freq: Three times a day (TID) | ORAL | Status: DC | PRN

## 2013-04-15 MED ORDER — DIAZEPAM 10 MG PO TABS: 10.0000 mg | ORAL_TABLET | Freq: Every evening | ORAL | Status: DC | PRN

## 2013-04-15 NOTE — Progress Notes (Signed)
Physical Therapy Evaluation Patient Details Name: Pamela Grimes MRN: [id number] DOB: Nov 28, 1961 Today's Date: 04/15/2013 Time: 4098-1191 PT Time Calculation (min): 31 min  PT Assessment / Plan / Recommendation Clinical Impression  Pt is 52 yo female s/p L4-5 PLIF who is mobilizing well with 6/10 pain and is experiencing some mild orthostasis. She ambulated 150' with RW as well as ascending and descending a flight of stairs with 1 rail and supervision. Recommend HHPT at d/c, PT will follow to progress safe mobilty and reinforce back precautions.    PT Assessment  Patient needs continued PT services    Follow Up Recommendations  Home health PT;Supervision - Intermittent    Does the patient have the potential to tolerate intense rehabilitation      Barriers to Discharge None      Equipment Recommendations  None recommended by PT    Recommendations for Other Services     Frequency Min 5X/week    Precautions / Restrictions Precautions Precautions: Back Precaution Booklet Issued: Yes (comment) Precaution Comments: reviewed BAT and proper seating, posture, etc Required Braces or Orthoses: Spinal Brace Spinal Brace: Applied in sitting position;Lumbar corset Restrictions Weight Bearing Restrictions: No   Pertinent Vitals/Pain BP supine 119/65, sitting 104/ 58 pt dizzy, improved after being up a few minutes      Mobility  Bed Mobility Bed Mobility: Rolling Right;Right Sidelying to Sit;Sit to Sidelying Right Rolling Right: 6: Modified independent (Device/Increase time) Right Sidelying to Sit: 6: Modified independent (Device/Increase time) Sit to Sidelying Right: 6: Modified independent (Device/Increase time) Details for Bed Mobility Assistance: vc's for keeping precautions, log rolling, pt able to perform correctly Transfers Transfers: Sit to Stand;Stand to Sit Sit to Stand: From bed;With upper extremity assist;6: Modified independent (Device/Increase time) Stand to Sit: 6:  Modified independent (Device/Increase time);To bed;With upper extremity assist Details for Transfer Assistance: vc's for proper hand placement with RW Ambulation/Gait Ambulation/Gait Assistance: 6: Modified independent (Device/Increase time) Ambulation Distance (Feet): 150 Feet Assistive device: Rolling walker Ambulation/Gait Assistance Details: RW used only for pain control, no balance deficits noted, cautious gait, decreased wt-shift right and left Gait Pattern: Step-through pattern;Decreased stride length;Decreased weight shift to right;Decreased weight shift to left Gait velocity: decreased Stairs: Yes Stairs Assistance: 5: Supervision Stairs Assistance Details (indicate cue type and reason): pt at first wanted to reach across body to hold rail with 2 hands, educated not to do this due to twisting, able to ascend with only 1 hand on rail. Ascended with alternating pattern , descended with step-to pattern leading with left leg due to mild weakness Stair Management Technique: One rail Right;Alternating pattern;Forwards Number of Stairs: 12 Wheelchair Mobility Wheelchair Mobility: No    Exercises General Exercises - Lower Extremity Ankle Circles/Pumps: AROM;Both;10 reps;Seated (for increasing BP)   PT Diagnosis: Abnormality of gait;Acute pain  PT Problem List: Decreased strength;Decreased activity tolerance;Decreased mobility;Pain;Decreased knowledge of precautions PT Treatment Interventions: DME instruction;Gait training;Stair training;Functional mobility training;Therapeutic activities;Therapeutic exercise;Patient/family education   PT Goals Acute Rehab PT Goals PT Goal Formulation: With patient Time For Goal Achievement: 04/22/13 Potential to Achieve Goals: Good Pt will Ambulate: >150 feet;with rolling walker;Independently PT Goal: Ambulate - Progress: Goal set today Pt will Go Up / Down Stairs: 1-2 stairs;Independently;with rail(s) PT Goal: Up/Down Stairs - Progress: Goal set  today Additional Goals Additional Goal #1: Pt will verbalize 3/3 back precautions and keep with mobility PT Goal: Additional Goal #1 - Progress: Goal set today  Visit Information  Last PT Received On: 04/15/13 Assistance Needed: +1  Subjective Data  Subjective: I felt awful yesterday and am a little dizzy now Patient Stated Goal: return to home and work   Prior Functioning  Home Living Lives With: Spouse Available Help at Discharge: Family;Friend(s);Available 24 hours/day Type of Home: House Home Access: Stairs to enter [employer] of Steps: 2 Entrance Stairs-Rails: Can reach both;Right;Left Home Layout: One level Bathroom Shower/Tub: Health visitor: Standard Bathroom Accessibility: Yes How Accessible: Accessible via walker Home Adaptive Equipment: Bedside commode/3-in-1;Walker - rolling Prior Function Level of Independence: Independent Able to Take Stairs?: Yes Driving: Yes Vocation: Full time employment Comments: sales, traveling, 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 Communication Communication: No difficulties Dominant Hand: Right    Cognition  Cognition Arousal/Alertness: Awake/alert Behavior During Therapy: WFL for tasks assessed/performed Overall Cognitive Status: Within Functional Limits for tasks assessed    Extremity/Trunk Assessment Right Upper Extremity Assessment RUE ROM/Strength/Tone: [facility] for tasks assessed Left Upper Extremity Assessment LUE ROM/Strength/Tone: WFL for tasks assessed Right Lower Extremity Assessment RLE ROM/Strength/Tone: Within functional levels RLE Sensation: WFL - Light Touch;WFL - Proprioception RLE Coordination: WFL - gross motor Left Lower Extremity Assessment LLE ROM/Strength/Tone: Deficits LLE ROM/Strength/Tone Deficits: grossly 4/5, feels better than before surgery, still descends stairs leading with LLE LLE Sensation: WFL - Light Touch LLE Coordination: WFL - gross motor Trunk Assessment Trunk Assessment:  Normal   Balance Balance Balance Assessed: Yes Dynamic Standing Balance Dynamic Standing - Balance Support: During functional activity;No upper extremity supported Dynamic Standing - Level of Assistance: 5: Stand by assistance  End of Session PT - End of Session Equipment Utilized During Treatment: Back brace;Gait belt Activity Tolerance: Patient tolerated treatment well Patient left: in bed;with call bell/phone within reach;with family/visitor present Nurse Communication: Mobility status  GP   Lyanne Co, PT  Acute Rehab Services  [phone]   Lyanne Co 04/15/2013, 4:00 PM

## 2013-04-15 NOTE — Discharge Summary (Signed)
  Physician Discharge Summary  Patient ID: Pamela Grimes MRN: [id number] DOB/AGE: [birth year] 52 y.o.  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 *   Discharged Condition: good  Hospital Course: patient [facility] hospital underwent an L4-5 decompression stabilization procedure. Postoperatively patient did very well recovered in the ICU just observe overnight because of some ectopy she had intraoperatively. All of her enzymes came out negative she was transferred to floor and did well the next 24-48 hours at the time of discharge she was still be discharged home scheduled followup approximately 1-2 weeks. She was discharged on oxycodone Flexeril.  Consults: Significant Diagnostic Studies: Treatments:L4-5 decompression stabilization Discharge Exam: Blood pressure 104/58, pulse 87, temperature 98.6 F (37 C), temperature source Oral, resp. rate 18, height 5\' 6"  (1.676 m), weight 76.794 kg (169 lb 4.8 oz), SpO2 100.00%. Strength out of 5 wound clean and dry  Disposition: home     Medication List    STOP taking these medications       ibuprofen 200 MG tablet  Commonly known as:  ADVIL,MOTRIN      TAKE these medications       B-12 PO  Take 1 tablet by mouth daily.     CALCIUM PO  Take 1 tablet by mouth daily.     cyclobenzaprine 10 MG tablet  Commonly known as:  FLEXERIL  Take 1 tablet (10 mg total) by mouth 3 (three) times daily as needed for muscle spasms.     diazepam 10 MG tablet  Commonly known as:  VALIUM  Take 1 tablet (10 mg total) by mouth at bedtime as needed for anxiety.     diazepam 10 MG tablet  Commonly known as:  VALIUM  Take 10 mg by mouth at bedtime as needed for anxiety.     HYDROcodone-acetaminophen 5-325 MG per tablet  Commonly known as:  NORCO/VICODIN  Take 1 tablet by mouth every 6 (six) hours as needed for pain.     methocarbamol 500 MG tablet  Commonly known as:  ROBAXIN  Take 500 mg by mouth daily.     oxyCODONE 5 MG immediate release tablet  Commonly known as:  Oxy IR/ROXICODONE  Take 3 tablets (15 mg total) by mouth every 4 (four) hours as needed.     oxyCODONE-acetaminophen 10-325 MG per tablet  Commonly known as:  PERCOCET  Take 1 tablet by mouth every 6 (six) hours as needed for pain (1-1 Q4-6h prn).     ranitidine 150 MG capsule  Commonly known as:  ZANTAC  Take 150 mg by mouth as needed for heartburn.     VITAMIN D PO  Take 1 tablet by mouth daily.           Follow-up Information   Follow up with Eloyse Causey P, MD In 1 week.   Contact information:   [address] [place] [address] [phone]       Signed: Luisa Louk P 04/15/2013, 4:40 PM

## 2013-04-15 NOTE — Progress Notes (Signed)
Pt continues to c/o back pain despite getting prn med q2h as prescribed along with q4h pain meds. Pt also c/o numbness of lower extremities. On call physician called with no new orders given; and instructed to continue pain regime as pt asks for pain meds along with monitoring vital signs as per order.  Will continue to monitor patient.

## 2013-04-15 NOTE — Progress Notes (Signed)
Subjective: Patient reports Overall she's doing okay she still said complete resolution her preoperative radicular symptoms his own were Pamela Grimes in her legs and she took off the and she developed a headache at that time  Objective: Vital signs in last 24 hours: Temp:  [98.1 F (36.7 C)-99.2 F (37.3 C)] 98.7 F (37.1 C) (05/07 0634) Pulse Rate:  [63-95] 95 (05/07 0634) Resp:  [9-20] 16 (05/07 0634) BP: (98-122)/(53-86) 112/56 mmHg (05/07 0634) SpO2:  [96 %-100 %] 100 % (05/07 0634) Weight:  [76.794 kg (169 lb 4.8 oz)] 76.794 kg (169 lb 4.8 oz) (05/06 1245)  Intake/Output from previous day: 05/06 0701 - 05/07 0700 In: 456.9 [P.O.:360; I.V.:96.9] Out: 1460 [Urine:1400; Drains:60] Intake/Output this shift:    Strength is 5 out of 5 wound is clean and dry sensation is intact to light touch in her lower 70s  Lab Results: No results found for this basename: WBC, HGB, HCT, PLT,  in the last 72 hours BMET No results found for this basename: NA, K, CL, CO2, GLUCOSE, BUN, CREATININE, CALCIUM,  in the last 72 hours  Studies/Results: Dg Lumbar Spine 2-3 Views  04/13/2013  *RADIOLOGY REPORT*  Clinical Data: Lumbar fusion.  LUMBAR SPINE - 2-3 VIEW, DG C-ARM 61-120 MIN  Comparison: MRI lumbar spine 11/12/2012.  Findings: We are provided with two fluoroscopic intraoperative spot views of the lumbar spine.  Images demonstrate pedicle screws and interbody spacer in place at L4-5. No fracture or other acute finding is identified.  Trace anterolisthesis of L4 on L5 is unchanged.  IMPRESSION: L4-5 fusion in progress.   Original Report Authenticated By: Holley Dexter, M.D.    Dg C-arm [phone] Min  04/13/2013  *RADIOLOGY REPORT*  Clinical Data: Lumbar fusion.  LUMBAR SPINE - 2-3 VIEW, DG C-ARM 61-120 MIN  Comparison: MRI lumbar spine 11/12/2012.  Findings: We are provided with two fluoroscopic intraoperative spot views of the lumbar spine.  Images demonstrate pedicle screws and interbody spacer in place at  L4-5. No fracture or other acute finding is identified.  Trace anterolisthesis of L4 on L5 is unchanged.  IMPRESSION: L4-5 fusion in progress.   Original Report Authenticated By: Holley Dexter, M.D.     Assessment/Plan: I discontinued her Hemovac 1 back on her compression stockings on immobilizer and put her give her a couple doses of Decadron and fioriocet for headaches , continue to mobilize her other day   LOS: 2 days     Pamela Grimes P 04/15/2013, 8:12 AM

## 2013-04-15 NOTE — Progress Notes (Signed)
Gave all health education, prescription and d/c paper work to the pt. D/c ed IV. Took the pt down in wheel chair, no concerns. Fuller Canada, RN

## 2013-04-16 NOTE — Care Management Note (Signed)
    Page 1 of 1   04/16/2013     11:12:15 AM   CARE MANAGEMENT NOTE 04/16/2013  Patient:  Pamela Grimes, Pamela Grimes   Account Number:  [id number]  Date Initiated:  04/16/2013  Documentation initiated by:  [facility]  Subjective/Objective Assessment:   admitted postop L4-5 PLIF     Action/Plan:   returned home  PT recommended HHPT but no HHPT ordered, patient to f/u with MD in 1wk   Anticipated DC Date:     Anticipated DC Plan:        DC Planning Services  CM consult      Choice offered to / List presented to:             Status of service:  Completed, signed off Medicare Important Message given?   (If response is "NO", the following Medicare IM given date fields will be blank) Date Medicare IM given:   Date Additional Medicare IM given:    Discharge Disposition:  HOME/SELF CARE  Per UR Regulation:  Reviewed for med. necessity/level of care/duration of stay  If discussed at Long Length of Stay Meetings, dates discussed:    Comments:

## 2013-04-20 ENCOUNTER — Ambulatory Visit (HOSPITAL_COMMUNITY)
Admission: RE | Admit: 2013-04-20 | Discharge: 2013-04-20 | Disposition: A | Discharge status: Home / Self Care | Payer: 59 | Source: Ambulatory Visit | Attending: Neurosurgery | Admitting: Neurosurgery

## 2013-04-20 ENCOUNTER — Other Ambulatory Visit (HOSPITAL_COMMUNITY): Payer: Self-pay | Admitting: Neurosurgery

## 2013-04-20 DIAGNOSIS — R600 Localized edema: Secondary | ICD-10-CM

## 2013-04-20 DIAGNOSIS — M79609 Pain in unspecified limb: Secondary | ICD-10-CM | POA: Insufficient documentation

## 2013-04-20 DIAGNOSIS — M7989 Other specified soft tissue disorders: Secondary | ICD-10-CM

## 2013-04-20 DIAGNOSIS — Z981 Arthrodesis status: Secondary | ICD-10-CM | POA: Insufficient documentation

## 2013-04-20 NOTE — Progress Notes (Signed)
VASCULAR LAB PRELIMINARY  PRELIMINARY  PRELIMINARY  PRELIMINARY  Left lower extremity venous duplex completed.    Preliminary report:  Left:  No evidence of DVT, superficial thrombosis, or Baker's cyst.  Ellorie Kindall, RVS 04/20/2013, 5:10 PM

## 2013-12-04 IMAGING — RF DG LUMBAR SPINE 2-3V
1 series · 2 of 2 positions shown · non-contrast
Comparison: MRI lumbar spine 11/12/2012.

CLINICAL DATA: Lumbar fusion.

LUMBAR SPINE - 2-3 VIEW, DG C-ARM 61-120 MIN

[Series 1: run · 2 of 2 slices shown]
[im 1/2]
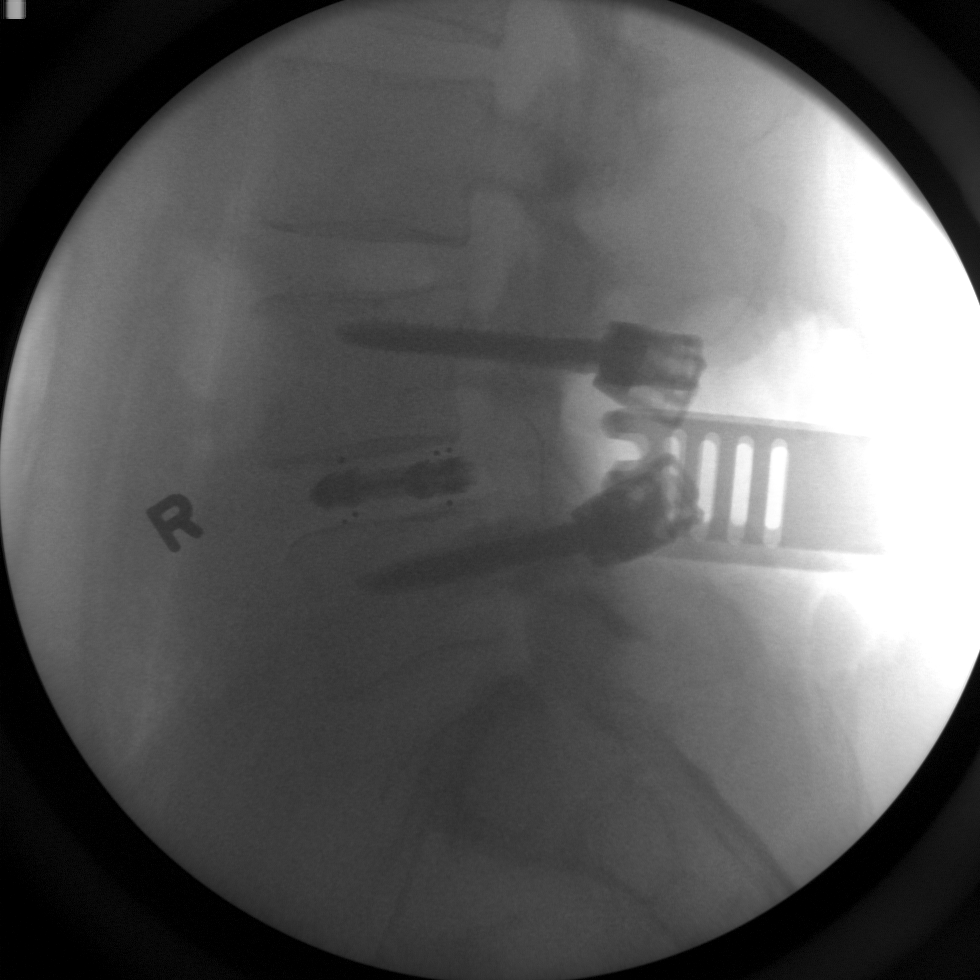
[im 2/2]
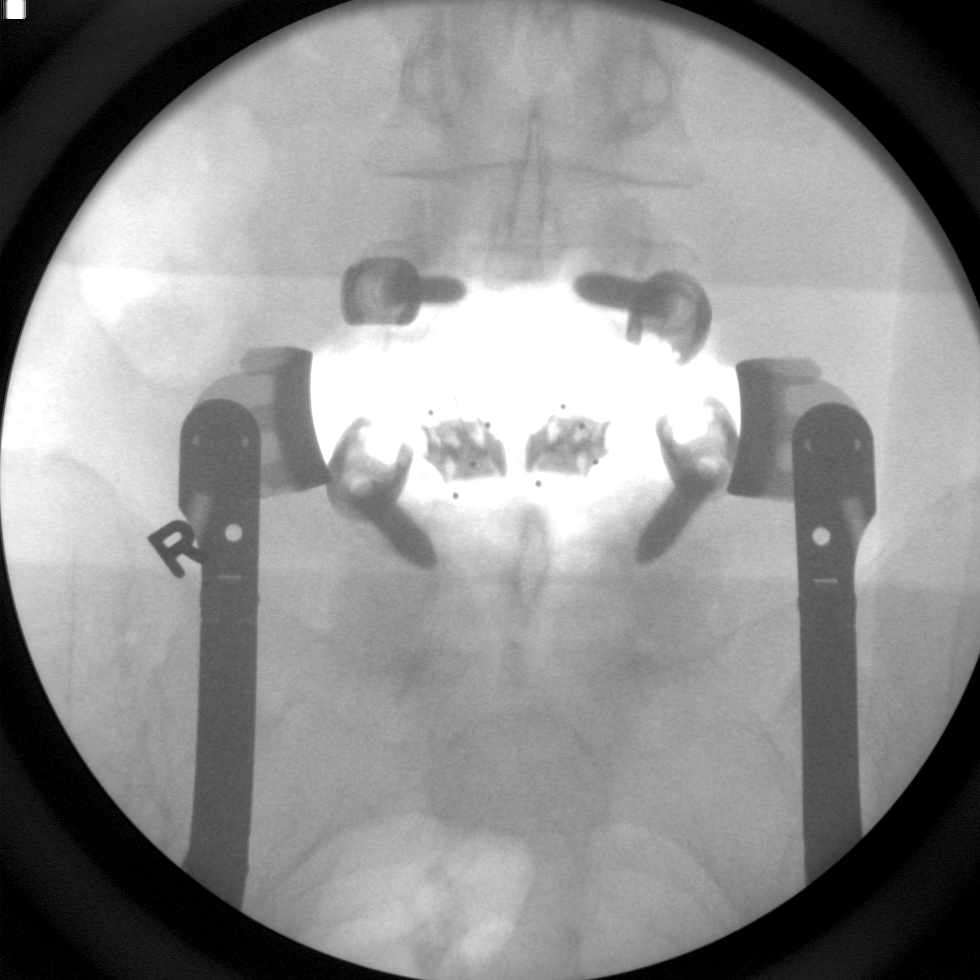

[2 of 2 positions shown; findings below may reference images not displayed]

FINDINGS: We are provided with two fluoroscopic intraoperative spot
views of the lumbar spine.  Images demonstrate pedicle screws and
interbody spacer in place at L4-5. No fracture or other acute
finding is identified.  Trace anterolisthesis of L4 on L5 is
unchanged.
IMPRESSION: L4-5 fusion in progress.

## 2014-05-17 ENCOUNTER — Other Ambulatory Visit: Payer: Self-pay | Admitting: Neurosurgery

## 2014-05-17 DIAGNOSIS — M5126 Other intervertebral disc displacement, lumbar region: Secondary | ICD-10-CM

## 2014-10-07 ENCOUNTER — Other Ambulatory Visit: Payer: Self-pay | Admitting: Gynecology

## 2014-10-07 DIAGNOSIS — N644 Mastodynia: Secondary | ICD-10-CM

## 2014-10-19 ENCOUNTER — Other Ambulatory Visit: Payer: 59

## 2015-12-11 DIAGNOSIS — E213 Hyperparathyroidism, unspecified: Secondary | ICD-10-CM

## 2015-12-11 HISTORY — DX: Hyperparathyroidism, unspecified: E21.3

## 2016-03-28 ENCOUNTER — Other Ambulatory Visit (HOSPITAL_COMMUNITY): Payer: Self-pay | Admitting: Endocrinology

## 2016-03-28 DIAGNOSIS — E210 Primary hyperparathyroidism: Secondary | ICD-10-CM

## 2016-04-05 ENCOUNTER — Encounter (HOSPITAL_COMMUNITY): Payer: Self-pay

## 2016-04-12 ENCOUNTER — Encounter (HOSPITAL_COMMUNITY): Payer: Self-pay

## 2016-04-19 ENCOUNTER — Encounter (HOSPITAL_COMMUNITY)
Admission: RE | Admit: 2016-04-19 | Discharge: 2016-04-19 | Disposition: A | Discharge status: Home / Self Care | Payer: 59 | Source: Ambulatory Visit | Attending: Endocrinology | Admitting: Endocrinology

## 2016-04-19 DIAGNOSIS — E210 Primary hyperparathyroidism: Secondary | ICD-10-CM | POA: Diagnosis present

## 2016-04-19 MED ORDER — TECHNETIUM TC 99M SESTAMIBI GENERIC - CARDIOLITE
25.0000 | Freq: Once | INTRAVENOUS | Status: AC | PRN
  Administered 2016-04-19: 25 via INTRAVENOUS

## 2016-04-26 ENCOUNTER — Ambulatory Visit: Payer: Self-pay | Admitting: Surgery

## 2016-06-20 ENCOUNTER — Encounter (HOSPITAL_COMMUNITY)
Admission: RE | Admit: 2016-06-20 | Discharge: 2016-06-20 | Disposition: A | Discharge status: Home / Self Care | Payer: Managed Care, Other (non HMO) | Source: Ambulatory Visit | Attending: Surgery | Admitting: Surgery

## 2016-06-20 ENCOUNTER — Encounter (HOSPITAL_COMMUNITY): Payer: Self-pay

## 2016-06-20 DIAGNOSIS — Z01812 Encounter for preprocedural laboratory examination: Secondary | ICD-10-CM | POA: Diagnosis present

## 2016-06-20 HISTORY — DX: Depression, unspecified: F32.A

## 2016-06-20 HISTORY — DX: Major depressive disorder, single episode, unspecified: F32.9

## 2016-06-20 HISTORY — DX: Hyperparathyroidism, unspecified: E21.3

## 2016-06-20 HISTORY — DX: Inflammatory liver disease, unspecified: K75.9

## 2016-06-20 LAB — CBC
HCT: 41.3 % (ref 36.0–46.0)
HEMOGLOBIN: 13.1 g/dL (ref 12.0–15.0)
MCH: 32.3 pg (ref 26.0–34.0)
MCHC: 31.7 g/dL (ref 30.0–36.0)
MCV: 102 fL — AB (ref 78.0–100.0)
Platelets: 190 10*3/uL (ref 150–400)
RBC: 4.05 MIL/uL (ref 3.87–5.11)
RDW: 12.4 % (ref 11.5–15.5)
WBC: 3.3 10*3/uL — AB (ref 4.0–10.5)

## 2016-06-20 NOTE — Patient Instructions (Addendum)
Pamela Grimes  06/20/2016   Your procedure is scheduled on: 06/28/16  Report to [facility] Main  Entrance take [facility]  elevators to 3rd floor to  Short Stay Center at 1:00 PM.  Call this number if you have problems the morning of surgery [phone]   Remember: ONLY 1 PERSON MAY GO WITH YOU TO SHORT STAY TO GET  READY MORNING OF YOUR SURGERY.  Do not eat food :After Midnight WED             Clear liquids ok until 9am on Thursday 06/28/16     Take these medicines the morning of surgery with A SIP OF WATER: Effexor, Oxycodone                               You may not have any metal on your body including hair pins and              piercings  Do not wear jewelry, make-up, lotions, powders or perfumes, deodorant             Do not wear nail polish.  Do not shave  48 hours prior to surgery.                 Do not bring valuables to the hospital. [place] IS NOT             RESPONSIBLE   FOR VALUABLES.  Contacts, dentures or bridgework may not be worn into surgery.      Patients discharged the day of surgery will not be allowed to drive home.  Name and phone number of your driver: Pamela Grimes  [facility] - Preparing for Surgery Before surgery, you can play an important role.  Because skin is not sterile, your skin needs to be as free of germs as possible.  You can reduce the number of germs on your skin by washing with CHG (chlorahexidine gluconate) soap before surgery.  CHG is an antiseptic cleaner which kills germs and bonds with the skin to continue killing germs even after washing. Please DO NOT use if you have an allergy to CHG or antibacterial soaps.  If your skin becomes reddened/irritated stop using the CHG and inform your nurse when you arrive at Short Stay. Do not shave (including legs and underarms) for at least 48 hours prior to the first CHG shower.  You may shave your face/neck. Please follow these instructions carefully:  1.  Shower with CHG Soap  the night before surgery and the  morning of Surgery.  2.  If you choose to wash your hair, wash your hair first as usual with your  normal  shampoo.  3.  After you shampoo, rinse your hair and body thoroughly to remove the  shampoo.                           4.  Use CHG as you would any other liquid soap.  You can apply chg directly  to the skin and wash                       Gently with a scrungie or clean washcloth.  5.  Apply the CHG Soap to your body ONLY FROM THE NECK DOWN.   Do not use  on face/ open                           Wound or open sores. Avoid contact with eyes, ears mouth and genitals (private parts).                       Wash face,  Genitals (private parts) with your normal soap.             6.  Wash thoroughly, paying special attention to the area where your surgery  will be performed.  7.  Thoroughly rinse your body with warm water from the neck down.  8.  DO NOT shower/wash with your normal soap after using and rinsing off  the CHG Soap.                9.  Pat yourself dry with a clean towel.            10.  Wear clean pajamas.            11.  Place clean sheets on your bed the night of your first shower and do not  sleep with pets. Day of Surgery : Do not apply any lotions/deodorants the morning of surgery.  Please wear clean clothes to the hospital/surgery center.  FAILURE TO FOLLOW THESE INSTRUCTIONS MAY RESULT IN THE CANCELLATION OF YOUR SURGERY PATIENT SIGNATURE_________________________________  NURSE SIGNATURE__________________________________  ________________________________________________________________________                _____________________________________________________________________                CLEAR LIQUID DIET   Foods Allowed                                                                     Foods Excluded  Coffee and tea, regular and decaf                             liquids that you cannot  Plain Jell-O in any flavor                                              see through such as: Fruit ices (not with fruit pulp)                                     milk, soups, orange juice  Iced Popsicles                                    All solid food Carbonated beverages, regular and diet                                    Cranberry, grape and apple juices Sports drinks like Gatorade Lightly seasoned clear broth or consume(fat free) Sugar, honey syrup  Sample Menu Breakfast                                Lunch                                     Supper Cranberry juice                    Beef broth                            Chicken broth Jell-O                                     Grape juice                           Apple juice Coffee or tea                        Jell-O                                      Popsicle                                                Coffee or tea                        Coffee or tea  _____________________________________________________________________

## 2016-06-21 NOTE — Pre-Procedure Instructions (Signed)
CBC result routed to Dr. Gerrit FriendsGerkin

## 2016-06-26 ENCOUNTER — Encounter (HOSPITAL_COMMUNITY): Payer: Self-pay | Admitting: Surgery

## 2016-06-26 DIAGNOSIS — E210 Primary hyperparathyroidism: Secondary | ICD-10-CM | POA: Diagnosis present

## 2016-06-26 NOTE — H&P (Signed)
General Surgery [facility]  Pamela Grimes DOB: 1961/03/04 Single / Language: English / Race: White Female  History of Present Illness  The patient is a 55 year old female who presents with a parathyroid neoplasm.  Patient is referred by Dr. Dorisann Frames for evaluation and treatment of primary hyperparathyroidism. Patient had been referred to endocrinology for fatigue and weight changes. She was diagnosed with hypothyroidism and currently takes levothyroxine 50 g daily for the past 6 months or so. Patient was noted on routine laboratory studies to have an elevated calcium level of 11.1. Subsequent testing showed an elevated intact PTH level ranging from 96-150. 24-hour urine collection for calcium was in the upper normal range at 292. Patient has had previous bone density scans which are reportedly normal. She does have a low vitamin D level and is currently on treatment. Patient notes chronic fatigue. She denies any history of nephrolithiasis. There is no family history of parathyroid disease. There is no family history of other endocrine neoplasms. Patient has had no prior surgery on the head or neck. Since today accompanied by her husband.  Patient underwent nuclear medicine parathyroid scan on Apr 19, 2016. 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   Other Problems Back Pain Depression Hepatitis Thyroid Disease  Past Surgical History  Cesarean Section - 1 Spinal Surgery - Lower Back  Diagnostic Studies History Colonoscopy never Mammogram 1-3 years ago Pap Smear 1-5 years ago  Allergies No Known Drug Allergies05/18/2017  Medication History Levothyroxine Sodium ( Tablet, Oral) Active. Meloxicam (7.5MG  Tablet, Oral) Active. Venlafaxine HCl ER (  Capsule ER 24HR, Oral) Active. OxyCODONE HCl (  Tablet, Oral as needed) Active. Medications Reconciled  Social  History Alcohol use Moderate alcohol use. Caffeine use Coffee. No drug use Tobacco use Former smoker.  Family History  Alcohol Abuse Father. Arthritis Mother. Breast Cancer Family Members In General. Ovarian Cancer Mother.  Pregnancy / Birth History Age at menarche 14 years. Contraceptive History Contraceptive implant, Oral contraceptives. Gravida 1 Irregular periods Maternal age 55-30 Para 1  Review of Systems General Present- Fatigue, Night Sweats and Weight Gain. Not Present- Appetite Loss, Chills, Fever and Weight Loss. Skin Present- Non-Healing Wounds. Not Present- Change in Wart/Mole, Dryness, Hives, Jaundice, New Lesions, Rash and Ulcer. HEENT Not Present- Earache, Hearing Loss, Hoarseness, Nose Bleed, Oral Ulcers, Ringing in the Ears, Seasonal Allergies, Sinus Pain, Sore Throat, Visual Disturbances, Wears glasses/contact lenses and Yellow Eyes. Respiratory Not Present- Bloody sputum, Chronic Cough, Difficulty Breathing, Snoring and Wheezing. Breast Not Present- Breast Mass, Breast Pain, Nipple Discharge and Skin Changes. Cardiovascular Not Present- Chest Pain, Difficulty Breathing Lying Down, Leg Cramps, Palpitations, Rapid Heart Rate, Shortness of Breath and Swelling of Extremities. Gastrointestinal Present- Bloating, Constipation and Excessive gas. Not Present- Abdominal Pain, Bloody Stool, Change in Bowel Habits, Chronic diarrhea, Difficulty Swallowing, Gets full quickly at meals, Hemorrhoids, Indigestion, Nausea, Rectal Pain and Vomiting. Female Genitourinary Present- Nocturia. Not Present- Frequency, Painful Urination, Pelvic Pain and Urgency. Musculoskeletal Present- Back Pain, Joint Pain, Joint Stiffness, Muscle Pain, Muscle Weakness and Swelling of Extremities. Neurological Present- Headaches, Numbness and Tingling. Not Present- Decreased Memory, Fainting, Seizures, Tremor, Trouble walking and Weakness. Psychiatric Present- Depression. Not Present-  Anxiety, Bipolar, Change in Sleep Pattern, Fearful and Frequent crying. Endocrine Present- Hot flashes. Not Present- Cold Intolerance, Excessive Hunger, Hair Changes, Heat Intolerance and New Diabetes. Hematology Present- Easy Bruising. Not Present- Excessive bleeding, Gland problems, HIV and Persistent Infections.  Vitals Weight: 182.8 lb Height:  66in Body Surface Area: 1.93 m Body Mass Index: 29.5 kg/m  Temp.: 97.59F(Temporal)  Pulse: 76 (Regular)  BP: 128/80 (Sitting, Left Arm, Standard)  Physical Exam  General - appears comfortable, no distress; not diaphorectic  HEENT - normocephalic; sclerae clear, gaze conjugate; mucous membranes moist, dentition good; voice normal  Neck - symmetric on extension; no palpable anterior or posterior cervical adenopathy; no palpable masses in the thyroid bed  Chest - clear bilaterally without rhonchi, rales, or wheeze  Cor - regular rhythm with normal rate; no significant murmur  Ext - non-tender without significant edema or lymphedema  Neuro - grossly intact; no tremor   Assessment & Plan  PRIMARY HYPERPARATHYROIDISM (E21.0)  Pt Education - Pamphlet Given - The Parathyroid Surgery Book: discussed with patient and provided information.  Patient presents with signs and symptoms of primary hyperparathyroidism. Patient is provided with written literature on parathyroid disease to review at home.  I reviewed the nuclear medicine parathyroid scan and laboratory studies with the patient and her husband at length. We discussed options for management. We discussed full neck exploration versus minimally invasive parathyroidectomy. We discussed the pros and cons of each procedure. I have recommended proceeding with minimally invasive parathyroidectomy with removal of the left superior parathyroid gland. This can be performed as an outpatient surgical procedure. Patient and her husband understand and agree to proceed with surgery in the near  future.  The risks and benefits of the procedure have been discussed at length with the patient. The patient understands the proposed procedure, potential alternative treatments, and the course of recovery to be expected. All of the patient's questions have been answered at this time. The patient wishes to proceed with surgery.  Velora Hecklerodd M. Jotham Ahn, MD, FACS General & Endocrine Surgery [facility] Office: [phone]

## 2016-06-28 ENCOUNTER — Ambulatory Visit (HOSPITAL_COMMUNITY)
Admission: RE | Admit: 2016-06-28 | Discharge: 2016-06-28 | Disposition: A | Discharge status: Home / Self Care | Payer: Managed Care, Other (non HMO) | Source: Ambulatory Visit | Attending: Surgery | Admitting: Surgery

## 2016-06-28 ENCOUNTER — Encounter (HOSPITAL_COMMUNITY): Payer: Self-pay | Admitting: *Deleted

## 2016-06-28 ENCOUNTER — Ambulatory Visit (HOSPITAL_COMMUNITY): Payer: Managed Care, Other (non HMO) | Admitting: Anesthesiology

## 2016-06-28 ENCOUNTER — Encounter (HOSPITAL_COMMUNITY)
Admission: RE | Disposition: A | Discharge status: Home / Self Care | Payer: Self-pay | Source: Ambulatory Visit | Attending: Surgery

## 2016-06-28 DIAGNOSIS — F419 Anxiety disorder, unspecified: Secondary | ICD-10-CM | POA: Insufficient documentation

## 2016-06-28 DIAGNOSIS — Z87891 Personal history of nicotine dependence: Secondary | ICD-10-CM | POA: Diagnosis not present

## 2016-06-28 DIAGNOSIS — Z79899 Other long term (current) drug therapy: Secondary | ICD-10-CM | POA: Diagnosis not present

## 2016-06-28 DIAGNOSIS — E210 Primary hyperparathyroidism: Secondary | ICD-10-CM | POA: Diagnosis present

## 2016-06-28 DIAGNOSIS — E039 Hypothyroidism, unspecified: Secondary | ICD-10-CM | POA: Insufficient documentation

## 2016-06-28 DIAGNOSIS — F329 Major depressive disorder, single episode, unspecified: Secondary | ICD-10-CM | POA: Insufficient documentation

## 2016-06-28 HISTORY — PX: PARATHYROIDECTOMY: SHX19

## 2016-06-28 SURGERY — PARATHYROIDECTOMY
Anesthesia: General | Site: Neck | Laterality: Left

## 2016-06-28 MED ORDER — HYDROMORPHONE HCL 1 MG/ML IJ SOLN
INTRAMUSCULAR | Status: AC
  Administered 2016-06-28: 0.5 mg via INTRAVENOUS
  Filled 2016-06-28: qty 1

## 2016-06-28 MED ORDER — HYDROMORPHONE HCL 1 MG/ML IJ SOLN
0.5000 mg | Freq: Once | INTRAMUSCULAR | Status: AC
  Administered 2016-06-28: 0.5 mg via INTRAVENOUS

## 2016-06-28 MED ORDER — LIDOCAINE HCL (CARDIAC) 20 MG/ML IV SOLN
INTRAVENOUS | Status: DC | PRN
  Administered 2016-06-28: 100 mg via INTRAVENOUS

## 2016-06-28 MED ORDER — FENTANYL CITRATE (PF) 100 MCG/2ML IJ SOLN
INTRAMUSCULAR | Status: DC | PRN
  Administered 2016-06-28: 50 ug via INTRAVENOUS
  Administered 2016-06-28: 100 ug via INTRAVENOUS
  Administered 2016-06-28 (×5): 50 ug via INTRAVENOUS

## 2016-06-28 MED ORDER — SUGAMMADEX SODIUM 200 MG/2ML IV SOLN
INTRAVENOUS | Status: DC | PRN
  Administered 2016-06-28: 160 mg via INTRAVENOUS

## 2016-06-28 MED ORDER — ONDANSETRON HCL 4 MG/2ML IJ SOLN
INTRAMUSCULAR | Status: DC | PRN
  Administered 2016-06-28: 4 mg via INTRAVENOUS

## 2016-06-28 MED ORDER — ROCURONIUM BROMIDE 100 MG/10ML IV SOLN
INTRAVENOUS | Status: AC
  Filled 2016-06-28: qty 1

## 2016-06-28 MED ORDER — FENTANYL CITRATE (PF) 100 MCG/2ML IJ SOLN
INTRAMUSCULAR | Status: AC
  Filled 2016-06-28: qty 2

## 2016-06-28 MED ORDER — DEXAMETHASONE SODIUM PHOSPHATE 10 MG/ML IJ SOLN
INTRAMUSCULAR | Status: AC
  Filled 2016-06-28: qty 1

## 2016-06-28 MED ORDER — MIDAZOLAM HCL 2 MG/2ML IJ SOLN
INTRAMUSCULAR | Status: AC
  Filled 2016-06-28: qty 2

## 2016-06-28 MED ORDER — BUPIVACAINE HCL (PF) 0.25 % IJ SOLN
INTRAMUSCULAR | Status: DC | PRN
  Administered 2016-06-28: 9 mL

## 2016-06-28 MED ORDER — MIDAZOLAM HCL 2 MG/2ML IJ SOLN
INTRAMUSCULAR | Status: DC | PRN
  Administered 2016-06-28: 2 mg via INTRAVENOUS

## 2016-06-28 MED ORDER — FENTANYL CITRATE (PF) 100 MCG/2ML IJ SOLN
INTRAMUSCULAR | Status: AC
  Administered 2016-06-28: 50 ug via INTRAVENOUS
  Filled 2016-06-28: qty 2

## 2016-06-28 MED ORDER — PROMETHAZINE HCL 25 MG/ML IJ SOLN: 6.2500 mg | INTRAMUSCULAR | Status: DC | PRN

## 2016-06-28 MED ORDER — PROPOFOL 10 MG/ML IV BOLUS
INTRAVENOUS | Status: DC | PRN
  Administered 2016-06-28: 150 mg via INTRAVENOUS
  Administered 2016-06-28: 20 mg via INTRAVENOUS
  Administered 2016-06-28: 30 mg via INTRAVENOUS

## 2016-06-28 MED ORDER — PROPOFOL 10 MG/ML IV BOLUS
INTRAVENOUS | Status: AC
  Filled 2016-06-28: qty 20

## 2016-06-28 MED ORDER — SUGAMMADEX SODIUM 200 MG/2ML IV SOLN
INTRAVENOUS | Status: AC
  Filled 2016-06-28: qty 2

## 2016-06-28 MED ORDER — LACTATED RINGERS IV SOLN
INTRAVENOUS | Status: DC | PRN
  Administered 2016-06-28 (×2): via INTRAVENOUS

## 2016-06-28 MED ORDER — FENTANYL CITRATE (PF) 100 MCG/2ML IJ SOLN
25.0000 ug | INTRAMUSCULAR | Status: DC | PRN
  Administered 2016-06-28 (×2): 50 ug via INTRAVENOUS

## 2016-06-28 MED ORDER — LIDOCAINE HCL (CARDIAC) 20 MG/ML IV SOLN
INTRAVENOUS | Status: AC
  Filled 2016-06-28: qty 5

## 2016-06-28 MED ORDER — ONDANSETRON HCL 4 MG/2ML IJ SOLN
INTRAMUSCULAR | Status: AC
  Filled 2016-06-28: qty 2

## 2016-06-28 MED ORDER — ACETAMINOPHEN 500 MG PO TABS
1000.0000 mg | ORAL_TABLET | Freq: Once | ORAL | Status: AC
  Administered 2016-06-28: 975 mg via ORAL

## 2016-06-28 MED ORDER — BUPIVACAINE HCL (PF) 0.25 % IJ SOLN
INTRAMUSCULAR | Status: AC
  Filled 2016-06-28: qty 30

## 2016-06-28 MED ORDER — CEFAZOLIN SODIUM-DEXTROSE 2-4 GM/100ML-% IV SOLN
INTRAVENOUS | Status: AC
  Filled 2016-06-28: qty 100

## 2016-06-28 MED ORDER — ACETAMINOPHEN 325 MG PO TABS
ORAL_TABLET | ORAL | Status: AC
  Filled 2016-06-28: qty 3

## 2016-06-28 MED ORDER — DEXAMETHASONE SODIUM PHOSPHATE 10 MG/ML IJ SOLN
INTRAMUSCULAR | Status: DC | PRN
  Administered 2016-06-28: 10 mg via INTRAVENOUS

## 2016-06-28 MED ORDER — ROCURONIUM BROMIDE 100 MG/10ML IV SOLN
INTRAVENOUS | Status: DC | PRN
  Administered 2016-06-28: 40 mg via INTRAVENOUS
  Administered 2016-06-28: 10 mg via INTRAVENOUS

## 2016-06-28 MED ORDER — CEFAZOLIN SODIUM-DEXTROSE 2-4 GM/100ML-% IV SOLN
2.0000 g | INTRAVENOUS | Status: AC
  Administered 2016-06-28: 2 g via INTRAVENOUS

## 2016-06-28 MED ORDER — OXYCODONE HCL 5 MG PO TABS: 5.0000 mg | ORAL_TABLET | ORAL | Status: DC | PRN

## 2016-06-28 SURGICAL SUPPLY — 40 items
APL SKNCLS STERI-STRIP NONHPOA (GAUZE/BANDAGES/DRESSINGS)
ATTRACTOMAT 16X20 MAGNETIC DRP (DRAPES) ×3 IMPLANT
BENZOIN TINCTURE PRP APPL 2/3 (GAUZE/BANDAGES/DRESSINGS) IMPLANT
BLADE HEX COATED 2.75 (ELECTRODE) ×3 IMPLANT
BLADE SURG 15 STRL LF DISP TIS (BLADE) ×1 IMPLANT
BLADE SURG 15 STRL SS (BLADE) ×3
CHLORAPREP W/TINT 26ML (MISCELLANEOUS) ×3 IMPLANT
CLIP TI MEDIUM 6 (CLIP) ×6 IMPLANT
CLIP TI WIDE RED SMALL 6 (CLIP) ×6 IMPLANT
CLOSURE WOUND 1/2 X4 (GAUZE/BANDAGES/DRESSINGS) ×1
COVER SURGICAL LIGHT HANDLE (MISCELLANEOUS) ×3 IMPLANT
DRAPE LAPAROTOMY T 98X78 PEDS (DRAPES) ×3 IMPLANT
DRESSING SURGICEL FIBRLLR 1X2 (HEMOSTASIS) ×1 IMPLANT
DRSG SURGICEL FIBRILLAR 1X2 (HEMOSTASIS) ×3
ELECT PENCIL ROCKER SW 15FT (MISCELLANEOUS) ×3 IMPLANT
ELECT REM PT RETURN 9FT ADLT (ELECTROSURGICAL) ×3
ELECTRODE REM PT RTRN 9FT ADLT (ELECTROSURGICAL) ×1 IMPLANT
GAUZE SPONGE 4X4 12PLY STRL (GAUZE/BANDAGES/DRESSINGS) ×2 IMPLANT
GAUZE SPONGE 4X4 16PLY XRAY LF (GAUZE/BANDAGES/DRESSINGS) ×3 IMPLANT
GLOVE SURG ORTHO 8.0 STRL STRW (GLOVE) ×3 IMPLANT
GOWN STRL REUS W/TWL XL LVL3 (GOWN DISPOSABLE) ×9 IMPLANT
KIT BASIN OR (CUSTOM PROCEDURE TRAY) ×3 IMPLANT
LIQUID BAND (GAUZE/BANDAGES/DRESSINGS) IMPLANT
NDL HYPO 25X1 1.5 SAFETY (NEEDLE) ×1 IMPLANT
NEEDLE HYPO 25X1 1.5 SAFETY (NEEDLE) ×3 IMPLANT
PACK BASIC VI WITH GOWN DISP (CUSTOM PROCEDURE TRAY) ×3 IMPLANT
STAPLER VISISTAT 35W (STAPLE) ×3 IMPLANT
STRIP CLOSURE SKIN 1/2X4 (GAUZE/BANDAGES/DRESSINGS) ×1 IMPLANT
SUT MNCRL AB 4-0 PS2 18 (SUTURE) ×3 IMPLANT
SUT SILK 2 0 (SUTURE)
SUT SILK 2-0 18XBRD TIE 12 (SUTURE) IMPLANT
SUT SILK 3 0 (SUTURE)
SUT SILK 3-0 18XBRD TIE 12 (SUTURE) IMPLANT
SUT VIC AB 3-0 SH 18 (SUTURE) ×3 IMPLANT
SYR BULB IRRIGATION 50ML (SYRINGE) ×3 IMPLANT
SYR CONTROL 10ML LL (SYRINGE) ×3 IMPLANT
TAPE CLOTH 3X10 WHT NS LF (GAUZE/BANDAGES/DRESSINGS) ×2 IMPLANT
TOWEL OR 17X26 10 PK STRL BLUE (TOWEL DISPOSABLE) ×3 IMPLANT
TOWEL OR NON WOVEN STRL DISP B (DISPOSABLE) ×3 IMPLANT
YANKAUER SUCT BULB TIP 10FT TU (MISCELLANEOUS) ×3 IMPLANT

## 2016-06-28 NOTE — Transfer of Care (Signed)
Immediate Anesthesia Transfer of Care Note  Patient: Pamela Grimes  Procedure(s) Performed: Procedure(s): LEFT SUPERIOR PARATHYROIDECTOMY (Left)  Patient Location: PACU  Anesthesia Type:General  Level of Consciousness: awake, alert  and oriented  Airway & Oxygen Therapy: Patient Spontanous Breathing and Patient connected to nasal cannula oxygen  Post-op Assessment: Report given to RN and Post -op Vital signs reviewed and stable  Post vital signs: Reviewed and stable  Last Vitals:  Filed Vitals:   06/28/16 1300  BP: 128/98  Pulse: 76  Temp: 37 C  Resp: 16    Last Pain:  Filed Vitals:   06/28/16 1309  PainSc: 8       Patients Stated Pain Goal: 3 (06/28/16 1307)  Complications: No apparent anesthesia complications

## 2016-06-28 NOTE — Anesthesia Procedure Notes (Signed)
Procedure Name: Intubation Date/Time: 06/28/2016 3:31 PM Performed by: Leroy LibmanEARDON, Kylle Lall L Patient Re-evaluated:Patient Re-evaluated prior to inductionOxygen Delivery Method: Circle system utilized Preoxygenation: Pre-oxygenation with 100% oxygen Intubation Type: IV induction Ventilation: Mask ventilation without difficulty and Oral airway inserted - appropriate to patient size Laryngoscope Size: Hyacinth MeekerMiller and 2 Grade View: Grade I Tube type: Reinforced Tube size: 7.0 mm Number of attempts: 1 Airway Equipment and Method: Stylet Placement Confirmation: ETT inserted through vocal cords under direct vision,  positive ETCO2 and breath sounds checked- equal and bilateral Secured at: 21 cm Tube secured with: Tape Dental Injury: Teeth and Oropharynx as per pre-operative assessment

## 2016-06-28 NOTE — Op Note (Signed)
OPERATIVE REPORT - PARATHYROIDECTOMY  Preoperative diagnosis: Primary hyperparathyroidism  Postop diagnosis: Same  Procedure: Left superior minimally invasive parathyroidectomy  Surgeon:  Velora Hecklerodd M. Aamira Bischoff, MD, FACS  Anesthesia: Gen. endotracheal  Estimated blood loss: Minimal  Preparation: ChloraPrep  Indications: The patient is a 55 year old female who presents with a parathyroid neoplasm. Patient is referred by Dr. Dorisann FramesBindubal Balan for evaluation and treatment of primary hyperparathyroidism. Patient had been referred to endocrinology for fatigue and weight changes. She was diagnosed with hypothyroidism and currently takes levothyroxine 50 g daily for the past 6 months or so. Patient was noted on routine laboratory studies to have an elevated calcium level of 11.1. Subsequent testing showed an elevated intact PTH level ranging from 96-150. 24-hour urine collection for calcium was in the upper normal range at 292. Nuclear med scan localized an adenoma to the left superior position.  Procedure: Patient was prepared in the holding area. He was brought to operating room and placed in a supine position on the operating room table. Following administration of general anesthesia, the patient was positioned and then prepped and draped in the usual strict aseptic fashion. After ascertaining that an adequate level of anesthesia been achieved, a neck incision was made with a #15 blade. Dissection was carried through subcutaneous tissues and platysma. Hemostasis was obtained with the electrocautery. Skin flaps were developed circumferentially and a Weitlander retractor was placed for exposure.  Strap muscles were incised in the midline. Strap muscles were reflected exposing the thyroid lobe. With gentle blunt dissection the thyroid lobe was mobilized.  Dissection was carried through adipose tissue and an enlarged parathyroid gland was identified. It was gently mobilized. Vascular structures were divided  between small and medium ligaclips. Care was taken to avoid the recurrent laryngeal nerve and the esophagus. The parathyroid gland was completely excised. It was submitted to pathology where frozen section confirmed parathyroid tissue consistent with adenoma.  Neck was irrigated with warm saline and good hemostasis was noted. Fibrillar was placed in the operative field. Strap muscles were reapproximated in the midline with interrupted 3-0 Vicryl sutures. Platysma was closed with interrupted 3-0 Vicryl sutures. Skin was closed with a running 4-0 Monocryl subcuticular suture. Marcaine was infiltrated circumferentially. Wound was washed and dried and benzoin and Steri-Strips were applied. Sterile gauze dressings were applied. Patient was awakened from anesthesia and brought to the recovery room. The patient tolerated the procedure well.   Velora Hecklerodd M. Darell Saputo, MD, FACS General & Endocrine Surgery [facility]

## 2016-06-28 NOTE — Anesthesia Preprocedure Evaluation (Signed)
Anesthesia Evaluation  Patient identified by MRN, date of birth, ID band Patient awake    Reviewed: Allergy & Precautions, NPO status , Patient's Chart, lab work & pertinent test results  History of Anesthesia Complications Negative for: history of anesthetic complications  Airway Mallampati: I  TM Distance: >3 FB Neck ROM: Full    Dental  (+) Teeth Intact, Dental Advisory Given   Pulmonary neg pulmonary ROS, former smoker,    Pulmonary exam normal breath sounds clear to auscultation       Cardiovascular Exercise Tolerance: Good negative cardio ROS Normal cardiovascular exam Rhythm:Regular Rate:Normal     Neuro/Psych PSYCHIATRIC DISORDERS Anxiety Depression negative neurological ROS     GI/Hepatic negative GI ROS, Neg liver ROS,   Endo/Other  Hypothyroidism   Renal/GU negative Renal ROS     Musculoskeletal  (+) Arthritis , Osteoarthritis,    Abdominal   Peds  Hematology negative hematology ROS (+)   Anesthesia Other Findings Day of surgery medications reviewed with the patient.  Reproductive/Obstetrics                             Anesthesia Physical Anesthesia Plan  ASA: II  Anesthesia Plan: General   Post-op Pain Management:    Induction: Intravenous  Airway Management Planned: Oral ETT  Additional Equipment:   Intra-op Plan:   Post-operative Plan: Extubation in OR  Informed Consent: I have reviewed the patients History and Physical, chart, labs and discussed the procedure including the risks, benefits and alternatives for the proposed anesthesia with the patient or authorized representative who has indicated his/her understanding and acceptance.   Dental advisory given  Plan Discussed with: CRNA  Anesthesia Plan Comments: (Risks/benefits of general anesthesia discussed with patient including risk of damage to teeth, lips, gum, and tongue, nausea/vomiting, allergic  reactions to medications, and the possibility of heart attack, stroke and death.  All patient questions answered.  Patient wishes to proceed.)        Anesthesia Quick Evaluation

## 2016-06-28 NOTE — Interval H&P Note (Signed)
History and Physical Interval Note:  06/28/2016 3:13 PM  Pamela Grimes  has presented today for surgery, with the diagnosis of primary hyperparthyroidism    The various methods of treatment have been discussed with the patient and family. After consideration of risks, benefits and other options for treatment, the patient has consented to    Procedure(s): LEFT SUPERIOR PARATHYROIDECTOMY (Left) as a surgical intervention .    The patient's history has been reviewed, patient examined, no change in status, stable for surgery.  I have reviewed the patient's chart and labs.  Questions were answered to the patient's satisfaction.    Velora Hecklerodd M. Perseus Westall, MD, [facility] Office: [phone]    Pamela Grimes Petit

## 2016-06-29 ENCOUNTER — Encounter (HOSPITAL_COMMUNITY): Payer: Self-pay | Admitting: Surgery

## 2016-06-29 NOTE — Anesthesia Postprocedure Evaluation (Signed)
Anesthesia Post Note  Patient: Pamela Grimes  Procedure(s) Performed: Procedure(s) (LRB): LEFT SUPERIOR PARATHYROIDECTOMY (Left)  Patient location during evaluation: PACU Anesthesia Type: General Level of consciousness: awake and alert Pain management: pain level controlled Vital Signs Assessment: post-procedure vital signs reviewed and stable Respiratory status: spontaneous breathing, nonlabored ventilation, respiratory function stable and patient connected to nasal cannula oxygen Cardiovascular status: blood pressure returned to baseline and stable Postop Assessment: no signs of nausea or vomiting Anesthetic complications: no    Last Vitals:  Filed Vitals:   06/28/16 1800 06/28/16 1810  BP: 122/85 121/77  Pulse: 78 80  Temp: 36.7 C 36.9 C  Resp: 16     Last Pain:  Filed Vitals:   06/28/16 1843  PainSc: 7                  Cecile HearingStephen Edward Myrta Mercer

## 2016-12-10 HISTORY — PX: DILATATION & CURETTAGE/HYSTEROSCOPY WITH MYOSURE: SHX6511

## 2016-12-10 IMAGING — NM NM PARATHYROID W/ SPECT
5 series · 20 of 20 positions shown · non-contrast
Comparison: None.

CLINICAL DATA: Hyperparathyroidism

EXAM:
NM PARATHYROID SCINTIGRAPHY AND SPECT IMAGING
TECHNIQUE: Following intravenous administration of radiopharmaceutical, early
and 2-hour delayed planar images were obtained in the anterior
projection. Delayed triplanar SPECT images were also obtained at 2
hours.
RADIOPHARMACEUTICALS:  25.0 MCi 0c-FFm Sestamibi IV

[Series 1: spect - (id)_(id)_cor · 8.3mm · 8.28mm/px · 6 of 64 frames shown]
[frame 6/64]
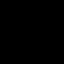
[frame 16/64]
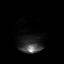
[frame 27/64]
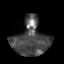
[frame 38/64]
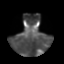
[frame 48/64]
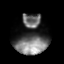
[frame 59/64]
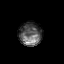

[Series 1: 15 min ant · 4.14mm/px · 1 of 1 slices shown]
[im 1/1]
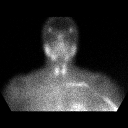

[Series 1: spect - (id)_(id)_tra · 8.3mm · 8.28mm/px · 6 of 64 frames shown]
[frame 6/64]
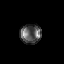
[frame 16/64]
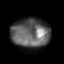
[frame 27/64]
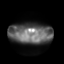
[frame 38/64]
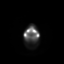
[frame 48/64]
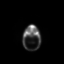
[frame 59/64]
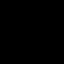

[Series 2: 2 hr ant · 4.14mm/px · 1 of 1 slices shown]
[im 1/1]
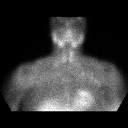

[Series 3: spect parathyroid · 8.28mm/px · 6 of 64 frames shown]
[frame 6/64]
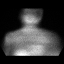
[frame 16/64]
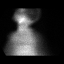
[frame 27/64]
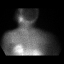
[frame 38/64]
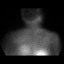
[frame 48/64]
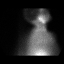
[frame 59/64]
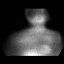

[20 of 20 positions shown; findings below may reference images not displayed]

FINDINGS: On the early images there is radiotracer activity identified within
both lobes of the thyroid gland. On the delayed phase images there
is a dominant focus of increased radiotracer uptake localizing to
the area around the upper pole of the left lobe of thyroid gland.
IMPRESSION: Dominant focus of persistent uptake localizing to the area around
the upper pole of left lobe of thyroid gland is identified. This may
be compatible with parathyroid adenoma.

## 2017-07-23 ENCOUNTER — Other Ambulatory Visit: Payer: Self-pay | Admitting: Internal Medicine

## 2017-07-23 DIAGNOSIS — R748 Abnormal levels of other serum enzymes: Secondary | ICD-10-CM

## 2017-07-29 ENCOUNTER — Ambulatory Visit
Admission: RE | Admit: 2017-07-29 | Discharge: 2017-07-29 | Disposition: A | Discharge status: Home / Self Care | Payer: BLUE CROSS/BLUE SHIELD | Source: Ambulatory Visit | Attending: Internal Medicine | Admitting: Internal Medicine

## 2017-07-29 DIAGNOSIS — R748 Abnormal levels of other serum enzymes: Secondary | ICD-10-CM

## 2017-08-16 ENCOUNTER — Other Ambulatory Visit: Payer: Self-pay | Admitting: Endocrinology

## 2017-08-16 DIAGNOSIS — M899 Disorder of bone, unspecified: Secondary | ICD-10-CM

## 2017-08-16 DIAGNOSIS — E210 Primary hyperparathyroidism: Secondary | ICD-10-CM

## 2017-08-21 ENCOUNTER — Other Ambulatory Visit: Payer: BLUE CROSS/BLUE SHIELD

## 2019-05-27 DIAGNOSIS — F332 Major depressive disorder, recurrent severe without psychotic features: Secondary | ICD-10-CM | POA: Diagnosis not present

## 2019-05-27 DIAGNOSIS — F411 Generalized anxiety disorder: Secondary | ICD-10-CM | POA: Diagnosis not present

## 2019-08-19 DIAGNOSIS — F411 Generalized anxiety disorder: Secondary | ICD-10-CM | POA: Diagnosis not present

## 2019-08-19 DIAGNOSIS — F332 Major depressive disorder, recurrent severe without psychotic features: Secondary | ICD-10-CM | POA: Diagnosis not present

## 2019-09-16 ENCOUNTER — Emergency Department (INDEPENDENT_AMBULATORY_CARE_PROVIDER_SITE_OTHER)
Admission: EM | Admit: 2019-09-16 | Discharge: 2019-09-16 | Disposition: A | Discharge status: Home / Self Care | Payer: BC Managed Care – PPO | Source: Home / Self Care

## 2019-09-16 ENCOUNTER — Encounter: Payer: Self-pay | Admitting: Emergency Medicine

## 2019-09-16 ENCOUNTER — Other Ambulatory Visit: Payer: Self-pay

## 2019-09-16 DIAGNOSIS — H6982 Other specified disorders of Eustachian tube, left ear: Secondary | ICD-10-CM | POA: Diagnosis not present

## 2019-09-16 DIAGNOSIS — H9203 Otalgia, bilateral: Secondary | ICD-10-CM | POA: Diagnosis not present

## 2019-09-16 DIAGNOSIS — H6692 Otitis media, unspecified, left ear: Secondary | ICD-10-CM

## 2019-09-16 MED ORDER — AMOXICILLIN-POT CLAVULANATE 875-125 MG PO TABS: 1.0000 | ORAL_TABLET | Freq: Two times a day (BID) | ORAL | 0 refills | Status: DC

## 2019-09-16 MED ORDER — PREDNISONE 20 MG PO TABS: ORAL_TABLET | ORAL | 0 refills | Status: DC

## 2019-09-16 NOTE — Discharge Instructions (Signed)

## 2019-09-16 NOTE — ED Provider Notes (Signed)
[facility]    CSN: [id number] Arrival date & time: 09/16/19  1423      History   Chief Complaint Chief Complaint  Patient presents with  . Otalgia    HPI Pamela Grimes is a 58 y.o. female.   HPI  Pamela Grimes is a 58 y.o. female presenting to UC with c/o 3 months of persistent bilateral ear pain, fullness and decreased hearing, occasional buzzing and yellow drainage. Symptoms started after she developed mild nasal congestion from her seasonal allergies. She has tried apple cider vinegar, coconut oil, and tea tree oil drops over the last few months w/o relief. Denies fever, chills, cough, congestion, n/v/d. No HA or dizziness.    Past Medical History:  Diagnosis Date  . Anxiety   . Arthritis   . Depression   . Hepatitis    age 89  . Hyperparathyroidism (HCC) 2017    Patient Active Problem List   Diagnosis Date Noted  . Hyperparathyroidism, primary (HCC) 06/26/2016    Past Surgical History:  Procedure Laterality Date  . BACK SURGERY    . CESAREAN SECTION    . PARATHYROIDECTOMY Left 06/28/2016   Procedure: LEFT SUPERIOR PARATHYROIDECTOMY;  Surgeon: Darnell Levelodd Gerkin, MD;  Location: [facility];  Service: General;  Laterality: Left;    OB History   No obstetric history on file.      Home Medications    Prior to Admission medications   Medication Sig Start Date End Date Taking? Authorizing Provider  amphetamine-dextroamphetamine (ADDERALL) 20 MG tablet Take 20 mg by mouth daily.   Yes [provider]  escitalopram (LEXAPRO) 10 MG tablet Take 10 mg by mouth daily.   Yes [provider]  progesterone (ENDOMETRIN) 100 MG vaginal insert Place 100 mg vaginally 2 (two) times daily.   Yes [provider]  amoxicillin-clavulanate (AUGMENTIN) 875-125 MG tablet Take 1 tablet by mouth 2 (two) times daily. One po bid x 7 days 09/16/19   Lurene ShadowPhelps, Willona Phariss O, PA-C  CALCIUM PO Take 1 tablet by mouth daily.    [provider]  Cholecalciferol  (VITAMIN D PO) Take 1 tablet by mouth daily.    [provider]  Cyanocobalamin (B-12 PO) Take 1 tablet by mouth daily.    [provider]  ibuprofen (ADVIL,MOTRIN) 200 MG tablet Take 600 mg by mouth 2 (two) times daily as needed for moderate pain.    [provider]  Milk Thistle 175 MG CAPS Take 175 mg by mouth every morning.    [provider]  predniSONE (DELTASONE) 20 MG tablet 3 tabs po day one, then 2 po daily x 4 days 09/16/19   Lurene ShadowPhelps, Levaughn Puccinelli O, PA-C    Family History Family History  Problem Relation Age of Onset  . Healthy Mother     Social History Social History   Tobacco Use  . Smoking status: Former Smoker    Packs/day: 0.50    Years: 30.00    Pack years: 15.00    Types: Cigarettes    Quit date: 12/21/2012    Years since quitting: 6.7  Substance Use Topics  . Alcohol use: Yes    Comment: 3x week  . Drug use: No     Allergies   Patient has no known allergies.   Review of Systems Review of Systems  Constitutional: Negative for chills and fever.  HENT: Positive for ear discharge, ear pain and tinnitus. Negative for congestion, sore throat, trouble swallowing and voice change.  Respiratory: Negative for cough and shortness of breath.   Cardiovascular: Negative for chest pain and palpitations.  Gastrointestinal: Negative for abdominal pain, diarrhea, nausea and vomiting.  Musculoskeletal: Negative for arthralgias, back pain and myalgias.  Skin: Negative for rash.  Neurological: Negative for dizziness and light-headedness.     Physical Exam Triage Vital Signs ED Triage Vitals  Enc Vitals Group     BP 09/16/19 1440 136/74     Pulse Rate 09/16/19 1440 67     Resp --      Temp 09/16/19 1440 98.4 F (36.9 C)     Temp Source 09/16/19 1440 Oral     SpO2 09/16/19 1440 96 %     Weight 09/16/19 1441 162 lb (73.5 kg)     Height 09/16/19 1441 5\' 6"  (1.676 m)     Head Circumference --      Peak Flow --      Pain Score 09/16/19  1441 2     Pain Loc --      Pain Edu? --      Excl. in [place]? --    No data found.  Updated Vital Signs BP 136/74 (BP Location: Right Arm)   Pulse 67   Temp 98.4 F (36.9 C) (Oral)   Ht 5\' 6"  (1.676 m)   Wt 162 lb (73.5 kg)   LMP 03/14/2016 (Approximate)   SpO2 96%   BMI 26.15 kg/m   Visual Acuity Right Eye Distance:   Left Eye Distance:   Bilateral Distance:    Right Eye Near:   Left Eye Near:    Bilateral Near:     Physical Exam Vitals signs and nursing note reviewed.  Constitutional:      Appearance: Normal appearance. She is well-developed.  HENT:     Head: Normocephalic and atraumatic.     Right Ear: A middle ear effusion is present. Tympanic membrane is not erythematous or bulging.     Left Ear: A middle ear effusion is present. Tympanic membrane is not erythematous or bulging.     Nose: Nose normal.     Right Sinus: No maxillary sinus tenderness or frontal sinus tenderness.     Left Sinus: No maxillary sinus tenderness or frontal sinus tenderness.     Mouth/Throat:     Lips: Pink.     Mouth: Mucous membranes are moist.     Pharynx: Oropharynx is clear. Uvula midline.  Neck:     Musculoskeletal: Normal range of motion.  Cardiovascular:     Rate and Rhythm: Normal rate and regular rhythm.  Pulmonary:     Effort: Pulmonary effort is normal. No respiratory distress.     Breath sounds: Normal breath sounds. No stridor. No wheezing, rhonchi or rales.  Musculoskeletal: Normal range of motion.  Skin:    General: Skin is warm and dry.  Neurological:     Mental Status: She is alert and oriented to person, place, and time.  Psychiatric:        Behavior: Behavior normal.      UC Treatments / Results  Labs (all labs ordered are listed, but only abnormal results are displayed) Labs Reviewed - No data to display  EKG   Radiology No results found.  Procedures Procedures (including critical care time)  Medications Ordered in UC Medications - No data to  display  Tympanometry: Left ear- Positive peak pressure, Right ear- normal   Initial Impression / Assessment and Plan / UC Course  I have reviewed the triage  vital signs and the nursing notes.  Pertinent labs & imaging results that were available during my care of the patient were reviewed by me and considered in my medical decision making (see chart for details).     Hx, exam, and tympanometry- c/w Left subacute otitis media Will tx with Augmentin and prednisone F/u with PCP as needed, may need referral to ENT if not improving  Final Clinical Impressions(s) / UC Diagnoses   Final diagnoses:  Subacute otitis media of left ear  Eustachian tube dysfunction, left  Otalgia of both ears     Discharge Instructions      Please take antibiotics as prescribed and be sure to complete entire course even if you start to feel better to ensure infection does not come back.  You may take 500mg  acetaminophen every 4-6 hours or in combination with ibuprofen 400-600mg  every 6-8 hours as needed for pain, inflammation, and fever.  Be sure to well hydrated with clear liquids and get at least 8 hours of sleep at night, preferably more while sick.   Please follow up with family medicine in 1 week if needed.     ED Prescriptions    Medication Sig Dispense Auth. Provider   amoxicillin-clavulanate (AUGMENTIN) 875-125 MG tablet Take 1 tablet by mouth 2 (two) times daily. One po bid x 7 days 14 tablet Lazlo Tunney O, PA-C   predniSONE (DELTASONE) 20 MG tablet 3 tabs po day one, then 2 po daily x 4 days 11 tablet 07-22-1987, Lurene Shadow     PDMP not reviewed this encounter.   [place], Lurene Shadow 09/16/19 1507

## 2019-09-16 NOTE — ED Triage Notes (Signed)
Bi-lateral ear pain x 3 months, discharge, buzzing.

## 2019-11-12 DIAGNOSIS — F332 Major depressive disorder, recurrent severe without psychotic features: Secondary | ICD-10-CM | POA: Diagnosis not present

## 2019-11-12 DIAGNOSIS — F411 Generalized anxiety disorder: Secondary | ICD-10-CM | POA: Diagnosis not present

## 2019-11-16 DIAGNOSIS — H524 Presbyopia: Secondary | ICD-10-CM | POA: Diagnosis not present

## 2020-02-09 DIAGNOSIS — E538 Deficiency of other specified B group vitamins: Secondary | ICD-10-CM | POA: Diagnosis not present

## 2020-02-09 DIAGNOSIS — I493 Ventricular premature depolarization: Secondary | ICD-10-CM | POA: Diagnosis not present

## 2020-02-09 DIAGNOSIS — R635 Abnormal weight gain: Secondary | ICD-10-CM | POA: Diagnosis not present

## 2020-02-09 DIAGNOSIS — N951 Menopausal and female climacteric states: Secondary | ICD-10-CM | POA: Diagnosis not present

## 2020-02-09 DIAGNOSIS — E559 Vitamin D deficiency, unspecified: Secondary | ICD-10-CM | POA: Diagnosis not present

## 2020-02-09 DIAGNOSIS — E039 Hypothyroidism, unspecified: Secondary | ICD-10-CM | POA: Diagnosis not present

## 2020-02-09 DIAGNOSIS — E7212 Methylenetetrahydrofolate reductase deficiency: Secondary | ICD-10-CM | POA: Diagnosis not present

## 2020-03-02 DIAGNOSIS — F411 Generalized anxiety disorder: Secondary | ICD-10-CM | POA: Diagnosis not present

## 2020-03-02 DIAGNOSIS — F332 Major depressive disorder, recurrent severe without psychotic features: Secondary | ICD-10-CM | POA: Diagnosis not present

## 2020-04-05 DIAGNOSIS — H60542 Acute eczematoid otitis externa, left ear: Secondary | ICD-10-CM | POA: Diagnosis not present

## 2020-04-07 DIAGNOSIS — E782 Mixed hyperlipidemia: Secondary | ICD-10-CM | POA: Diagnosis not present

## 2020-04-07 DIAGNOSIS — N951 Menopausal and female climacteric states: Secondary | ICD-10-CM | POA: Diagnosis not present

## 2020-04-07 DIAGNOSIS — K760 Fatty (change of) liver, not elsewhere classified: Secondary | ICD-10-CM | POA: Diagnosis not present

## 2020-04-19 DIAGNOSIS — N951 Menopausal and female climacteric states: Secondary | ICD-10-CM | POA: Diagnosis not present

## 2020-04-19 DIAGNOSIS — R635 Abnormal weight gain: Secondary | ICD-10-CM | POA: Diagnosis not present

## 2020-04-19 DIAGNOSIS — E039 Hypothyroidism, unspecified: Secondary | ICD-10-CM | POA: Diagnosis not present

## 2020-04-19 DIAGNOSIS — Z13220 Encounter for screening for lipoid disorders: Secondary | ICD-10-CM | POA: Diagnosis not present

## 2020-04-20 DIAGNOSIS — H60542 Acute eczematoid otitis externa, left ear: Secondary | ICD-10-CM | POA: Diagnosis not present

## 2020-05-25 DIAGNOSIS — F332 Major depressive disorder, recurrent severe without psychotic features: Secondary | ICD-10-CM | POA: Diagnosis not present

## 2020-05-25 DIAGNOSIS — F411 Generalized anxiety disorder: Secondary | ICD-10-CM | POA: Diagnosis not present

## 2020-09-20 DIAGNOSIS — F332 Major depressive disorder, recurrent severe without psychotic features: Secondary | ICD-10-CM | POA: Diagnosis not present

## 2020-12-13 DIAGNOSIS — Z03818 Encounter for observation for suspected exposure to other biological agents ruled out: Secondary | ICD-10-CM | POA: Diagnosis not present

## 2020-12-21 DIAGNOSIS — F332 Major depressive disorder, recurrent severe without psychotic features: Secondary | ICD-10-CM | POA: Diagnosis not present

## 2020-12-30 ENCOUNTER — Other Ambulatory Visit: Payer: Self-pay

## 2020-12-30 ENCOUNTER — Ambulatory Visit: Payer: BC Managed Care – PPO | Admitting: Cardiology

## 2020-12-30 ENCOUNTER — Encounter: Payer: Self-pay | Admitting: Cardiology

## 2020-12-30 ENCOUNTER — Inpatient Hospital Stay: Payer: BC Managed Care – PPO

## 2020-12-30 VITALS — BP 175/100 | HR 87 | Temp 98.0°F | Wt 174.0 lb

## 2020-12-30 DIAGNOSIS — M722 Plantar fascial fibromatosis: Secondary | ICD-10-CM | POA: Insufficient documentation

## 2020-12-30 DIAGNOSIS — I493 Ventricular premature depolarization: Secondary | ICD-10-CM

## 2020-12-30 DIAGNOSIS — M720 Palmar fascial fibromatosis [Dupuytren]: Secondary | ICD-10-CM | POA: Insufficient documentation

## 2020-12-30 MED ORDER — METOPROLOL TARTRATE 50 MG PO TABS: 50.0000 mg | ORAL_TABLET | Freq: Two times a day (BID) | ORAL | 3 refills | Status: DC

## 2020-12-30 NOTE — Progress Notes (Signed)
Follow up visit  Subjective:   Pamela Grimes, female    DOB: 07-Feb-1961, 60 y.o.   MRN: [id number]   HPI  Chief Complaint  Patient presents with   Palpitations    60 y.o. Caucasian female with h/o thyroid nodules, ADD, symptomatic PVC's  Patient has been having palpitations lasting for couple hours for last few months. Patient does have associated dyspnea with palpitations. Otherwise,e her mobility is limited due to orthopedic issues, but doe snot have any chest pain, exertional dyspnea.   Blood pressure elevated today, usually not this high. She drinks green tea, but does not drink coffee.  Current Outpatient Medications on File Prior to Visit  Medication Sig Dispense Refill   amphetamine-dextroamphetamine (ADDERALL) 20 MG tablet Take 20 mg by mouth daily.     Cholecalciferol (VITAMIN D PO) Take 1 tablet by mouth daily.     Cyanocobalamin (B-12 PO) Take 1 tablet by mouth daily.     DHEA 50 MG TABS Take 25 mg by mouth daily.     escitalopram (LEXAPRO) 10 MG tablet Take 10 mg by mouth daily.     Garlic 10 MG CAPS Take 1 capsule by mouth daily.     HAWTHORN BERRY PO Take 1 capsule by mouth daily.     Magnesium Citrate 200 MG TABS Take 2 capsules by mouth daily.     NON FORMULARY Take 2 capsules by mouth daily. Liver refresh     Probiotic Product (PRO-BIOTIC BLEND PO) Take 1 capsule by mouth daily.     progesterone (PROMETRIUM) 200 MG capsule Take 200 mg by mouth at bedtime.     thyroid (ARMOUR) 60 MG tablet BREAK ONE TABLET WITH TEETH AND DISSOLVE UNDER TONGUE IN AM DAILY, MAY EAT IN 15 MINUTES     No current facility-administered medications on file prior to visit.    Cardiovascular & other pertient studies:  EKG 12/30/2020: Sinus rhythm 91 bpm Combined biatrial enlargement  Echocardiogram 09/27/2018: LVEF 56% Frequent ectopy noted Otherwise normal  Holter monitor 09/26/2018-09/29/2018: Dominant rhythm: Sinus. HR 66-121 bpm. Avg HR 93 bpm. 10%  VE Rare SVE 9 beat NSVT No atrial fibrillation/atrial flutter/VT/high grade AV block, sinus pause >3sec noted.  EKG 12/30/2020: Sinus rhythm 91 bpm Combined biatrial enlargement   Recent labs: Not available   Review of Systems  Cardiovascular: Positive for palpitations. Negative for chest pain, dyspnea on exertion, leg swelling and syncope.  Respiratory: Positive for shortness of breath.          Vitals:   12/30/20 1357 12/30/20 1413  BP: (!) 174/99 (!) 175/100  Pulse: 81 87  Temp: 98 F (36.7 C)   SpO2: 98%     Body mass index is 28.08 kg/m. Filed Weights   12/30/20 1357  Weight: 174 lb (78.9 kg)     Objective:   Physical Exam Vitals and nursing note reviewed.  Constitutional:      General: She is not in acute distress. Neck:     Vascular: No JVD.  Cardiovascular:     Rate and Rhythm: Normal rate and regular rhythm.     Heart sounds: Normal heart sounds. No murmur heard.   Pulmonary:     Effort: Pulmonary effort is normal.     Breath sounds: Normal breath sounds. No wheezing or rales.  Musculoskeletal:     Right lower leg: No edema.     Left lower leg: No edema.           Assessment & Recommendations:  60 y.o. Caucasian female with h/o thyroid nodules, ADD, symptomatic PVC's  Symptomatic PVC's: Check TSH. Started metoprolol tartarate 50 mg bid. Will repeat cardiac telemetry, previously had 10% PVC burden in 2019.   F/u in 4 weeks   Elder Negus, MD Pager: [phone] Office: [phone]

## 2021-01-12 DIAGNOSIS — I493 Ventricular premature depolarization: Secondary | ICD-10-CM | POA: Diagnosis not present

## 2021-01-17 DIAGNOSIS — I493 Ventricular premature depolarization: Secondary | ICD-10-CM | POA: Diagnosis not present

## 2021-01-20 DIAGNOSIS — I493 Ventricular premature depolarization: Secondary | ICD-10-CM | POA: Diagnosis not present

## 2021-01-26 NOTE — Progress Notes (Signed)
I sent the following message to the patient on MyChart. I have not received any response. Please convey the following to the patient.  Ms. Dildine,  Your heart monitor did show frequent PVC and occasional nonsustained ventricular tachycardia (fast runs of PVCs) We should continue metoprolol and repeat heart monitor in 2 weeks, so we can reassess response to metoprolol. Keep appt on 3/7. Please let me know if you agree with the plan, and I will order the monitor.  Regards, Dr. Rosemary Holms

## 2021-01-26 NOTE — Progress Notes (Signed)
Called patient, NA, LMAM

## 2021-01-27 ENCOUNTER — Ambulatory Visit: Payer: BC Managed Care – PPO | Admitting: Cardiology

## 2021-01-27 NOTE — Progress Notes (Signed)
Ok

## 2021-01-27 NOTE — Progress Notes (Signed)
Attempted to call pt, no answer. Left vm requesting call back.

## 2021-01-27 NOTE — Progress Notes (Signed)
Spoke with patient. She is willing to have another monitor on, but will do it when she comes in to her OV after she discusses it with you then.

## 2021-02-13 ENCOUNTER — Ambulatory Visit: Payer: BC Managed Care – PPO | Admitting: Cardiology

## 2021-02-16 ENCOUNTER — Encounter: Payer: Self-pay | Admitting: Cardiology

## 2021-02-16 ENCOUNTER — Ambulatory Visit: Payer: BC Managed Care – PPO | Admitting: Cardiology

## 2021-02-16 ENCOUNTER — Other Ambulatory Visit: Payer: Self-pay

## 2021-02-16 VITALS — BP 136/86 | HR 73 | Temp 97.2°F | Resp 17 | Ht 66.0 in | Wt 176.6 lb

## 2021-02-16 DIAGNOSIS — I493 Ventricular premature depolarization: Secondary | ICD-10-CM | POA: Diagnosis not present

## 2021-02-16 NOTE — Progress Notes (Signed)
Follow up visit  Subjective:   Pamela Grimes, female    DOB: [birth year], 60 y.o.   MRN: [id number]   HPI  Chief Complaint  Patient presents with  . Follow-up    4 weeks    60 y.o. Caucasian female with h/o thyroid nodules, ADD, symptomatic PVC's  Patient has had improvement in symptoms with metoprolol. She denies chest pain, shortness of breath, palpitations, leg edema, orthopnea, PND, TIA/syncope.  Initial consultation HPI 01/2021: Patient has been having palpitations lasting for couple hours for last few months. Patient does have associated dyspnea with palpitations. Otherwise,e her mobility is limited due to orthopedic issues, but doe snot have any chest pain, exertional dyspnea.   Blood pressure elevated today, usually not this high. She drinks green tea, but does not drink coffee.  Current Outpatient Medications on File Prior to Visit  Medication Sig Dispense Refill  . amphetamine-dextroamphetamine (ADDERALL) 20 MG tablet Take 20 mg by mouth daily.    . Cholecalciferol (VITAMIN D PO) Take 1 tablet by mouth daily.    . Cyanocobalamin (B-12 PO) Take 1 tablet by mouth daily.    Marland Kitchen DHEA 50 MG TABS Take 25 mg by mouth daily.    Marland Kitchen escitalopram (LEXAPRO) 10 MG tablet Take 10 mg by mouth daily.    . Garlic 10 MG CAPS Take 1 capsule by mouth daily.    Marland Kitchen HAWTHORN BERRY PO Take 1 capsule by mouth daily.    . Magnesium Citrate 200 MG TABS Take 2 capsules by mouth daily.    . metoprolol tartrate (LOPRESSOR) 50 MG tablet Take 1 tablet (50 mg total) by mouth 2 (two) times daily. 60 tablet 3  . NON FORMULARY Take 2 capsules by mouth daily. Liver refresh    . Probiotic Product (PRO-BIOTIC BLEND PO) Take 1 capsule by mouth daily.    . progesterone (PROMETRIUM) 200 MG capsule Take 200 mg by mouth at bedtime.    Marland Kitchen thyroid (ARMOUR) 60 MG tablet BREAK ONE TABLET WITH TEETH AND DISSOLVE UNDER TONGUE IN AM DAILY, MAY EAT IN 15 MINUTES     No current facility-administered medications on file  prior to visit.    Cardiovascular & other pertient studies:   EKG 02/16/2021: Sinus rhythm with ventricular bigeminy Combined atrial enlargement Anterior infarct -age undetermined  Mobile cardiac telemetry 13 days 12/30/2020 - 01/13/2021: Dominant rhythm: Sinus. HR 49-131 bpm. Avg HR 81 bpm, while in sinus rhythm. 2 episodes of SVT, fastest and longest at 156 bpm for 11 beats <1% isolated SVE, couplet 118 episodes of NSVT, fastest at 179 bpm for 4 beats, longest for 9 beats at 134 bpm. 23.5% isolated VE including ventricular bigeminy and trigeminy <1% couplet/triplets. No atrial fibrillation/atrial flutter//high grade AV block, sinus pause >3sec noted. 17 patient triggered events correlate with ventricular ectopy.    EKG 12/30/2020: Sinus rhythm 91 bpm Combined biatrial enlargement  Echocardiogram 09/27/2018: LVEF 56% Frequent ectopy noted Otherwise normal  Holter monitor 09/26/2018-09/29/2018: Dominant rhythm: Sinus. HR 66-121 bpm. Avg HR 93 bpm. 10% VE Rare SVE 9 beat NSVT No atrial fibrillation/atrial flutter/VT/high grade AV block, sinus pause >3sec noted.   Recent labs: Not available   Review of Systems  Cardiovascular: Negative for chest pain, dyspnea on exertion, leg swelling, palpitations and syncope.  Respiratory: Negative for shortness of breath.          Vitals:   02/16/21 1348  BP: 136/86  Pulse: 73  Resp: 17  Temp: (!) 97.2 F (36.2 C)  SpO2: 98%     Body mass index is 28.5 kg/m. Filed Weights   02/16/21 1348  Weight: 176 lb 9.6 oz (80.1 kg)     Objective:   Physical Exam Vitals and nursing note reviewed.  Constitutional:      General: She is not in acute distress. Neck:     Vascular: No JVD.  Cardiovascular:     Rate and Rhythm: Normal rate and regular rhythm. Frequent extrasystoles are present.    Heart sounds: Normal heart sounds. No murmur heard.   Pulmonary:     Effort: Pulmonary effort is normal.     Breath sounds:  Normal breath sounds. No wheezing or rales.  Musculoskeletal:     Right lower leg: No edema.     Left lower leg: No edema.           Assessment & Recommendations:   60 y.o. Caucasian female with h/o thyroid nodules, ADD, symptomatic PVC's  Symptomatic PVC's: Symptoms improved with metoprolol tartarate 50 mg bid. However, she still has frequent PVC's, ventricular bigeminy. Will check echocardiogram to evaluate baseline LV function. Will also check exercise treadmill stress test and CAT cardiac scoring, in case she would need flecainide in future for management of PVC's.  Repeat cardiac telemetry in 3 months  F/u in 3 months    Elder Negus, MD Pager: [phone] Office: [phone]

## 2021-03-22 DIAGNOSIS — F332 Major depressive disorder, recurrent severe without psychotic features: Secondary | ICD-10-CM | POA: Diagnosis not present

## 2021-03-28 ENCOUNTER — Other Ambulatory Visit: Payer: Self-pay | Admitting: Cardiology

## 2021-03-28 DIAGNOSIS — I493 Ventricular premature depolarization: Secondary | ICD-10-CM

## 2021-03-29 ENCOUNTER — Other Ambulatory Visit: Payer: BC Managed Care – PPO

## 2021-04-13 ENCOUNTER — Inpatient Hospital Stay: Admission: RE | Admit: 2021-04-13 | Payer: BC Managed Care – PPO | Source: Ambulatory Visit

## 2021-04-14 ENCOUNTER — Other Ambulatory Visit: Payer: BC Managed Care – PPO

## 2021-04-21 ENCOUNTER — Ambulatory Visit: Payer: BC Managed Care – PPO

## 2021-04-21 ENCOUNTER — Other Ambulatory Visit: Payer: Self-pay

## 2021-04-21 DIAGNOSIS — I493 Ventricular premature depolarization: Secondary | ICD-10-CM

## 2021-04-27 ENCOUNTER — Other Ambulatory Visit: Payer: Self-pay

## 2021-04-27 DIAGNOSIS — M545 Low back pain, unspecified: Secondary | ICD-10-CM | POA: Diagnosis not present

## 2021-05-01 ENCOUNTER — Other Ambulatory Visit: Payer: Self-pay

## 2021-05-16 DIAGNOSIS — Z13220 Encounter for screening for lipoid disorders: Secondary | ICD-10-CM | POA: Diagnosis not present

## 2021-05-16 DIAGNOSIS — E538 Deficiency of other specified B group vitamins: Secondary | ICD-10-CM | POA: Diagnosis not present

## 2021-05-16 DIAGNOSIS — I493 Ventricular premature depolarization: Secondary | ICD-10-CM | POA: Diagnosis not present

## 2021-05-16 DIAGNOSIS — E559 Vitamin D deficiency, unspecified: Secondary | ICD-10-CM | POA: Diagnosis not present

## 2021-05-16 DIAGNOSIS — E039 Hypothyroidism, unspecified: Secondary | ICD-10-CM | POA: Diagnosis not present

## 2021-05-16 DIAGNOSIS — Z1589 Genetic susceptibility to other disease: Secondary | ICD-10-CM | POA: Diagnosis not present

## 2021-05-16 DIAGNOSIS — N951 Menopausal and female climacteric states: Secondary | ICD-10-CM | POA: Diagnosis not present

## 2021-05-17 ENCOUNTER — Other Ambulatory Visit: Payer: Self-pay

## 2021-06-21 DIAGNOSIS — F332 Major depressive disorder, recurrent severe without psychotic features: Secondary | ICD-10-CM | POA: Diagnosis not present

## 2021-06-22 ENCOUNTER — Ambulatory Visit: Payer: BC Managed Care – PPO | Admitting: Cardiology

## 2021-07-12 ENCOUNTER — Ambulatory Visit: Payer: BC Managed Care – PPO | Admitting: Cardiology

## 2021-07-31 ENCOUNTER — Ambulatory Visit: Payer: BC Managed Care – PPO | Admitting: Cardiology

## 2021-08-15 ENCOUNTER — Ambulatory Visit: Payer: BC Managed Care – PPO | Admitting: Cardiology

## 2021-09-13 DIAGNOSIS — F332 Major depressive disorder, recurrent severe without psychotic features: Secondary | ICD-10-CM | POA: Diagnosis not present

## 2021-09-30 ENCOUNTER — Other Ambulatory Visit: Payer: Self-pay | Admitting: Cardiology

## 2021-09-30 DIAGNOSIS — I493 Ventricular premature depolarization: Secondary | ICD-10-CM

## 2021-10-13 DIAGNOSIS — M25512 Pain in left shoulder: Secondary | ICD-10-CM | POA: Diagnosis not present

## 2021-12-14 DIAGNOSIS — F332 Major depressive disorder, recurrent severe without psychotic features: Secondary | ICD-10-CM | POA: Diagnosis not present

## 2022-01-10 DIAGNOSIS — Z0000 Encounter for general adult medical examination without abnormal findings: Secondary | ICD-10-CM | POA: Diagnosis not present

## 2022-01-10 DIAGNOSIS — E559 Vitamin D deficiency, unspecified: Secondary | ICD-10-CM | POA: Diagnosis not present

## 2022-01-10 DIAGNOSIS — E039 Hypothyroidism, unspecified: Secondary | ICD-10-CM | POA: Diagnosis not present

## 2022-01-10 DIAGNOSIS — R3589 Other polyuria: Secondary | ICD-10-CM | POA: Diagnosis not present

## 2022-01-17 DIAGNOSIS — I493 Ventricular premature depolarization: Secondary | ICD-10-CM | POA: Diagnosis not present

## 2022-01-17 DIAGNOSIS — Z0000 Encounter for general adult medical examination without abnormal findings: Secondary | ICD-10-CM | POA: Diagnosis not present

## 2022-01-21 DIAGNOSIS — L03211 Cellulitis of face: Secondary | ICD-10-CM | POA: Diagnosis not present

## 2022-01-21 DIAGNOSIS — L089 Local infection of the skin and subcutaneous tissue, unspecified: Secondary | ICD-10-CM | POA: Diagnosis not present

## 2022-01-21 DIAGNOSIS — R220 Localized swelling, mass and lump, head: Secondary | ICD-10-CM | POA: Diagnosis not present

## 2022-01-21 DIAGNOSIS — R21 Rash and other nonspecific skin eruption: Secondary | ICD-10-CM | POA: Diagnosis not present

## 2022-03-20 DIAGNOSIS — F332 Major depressive disorder, recurrent severe without psychotic features: Secondary | ICD-10-CM | POA: Diagnosis not present

## 2022-05-10 ENCOUNTER — Other Ambulatory Visit: Payer: Self-pay

## 2022-05-25 ENCOUNTER — Ambulatory Visit: Payer: BC Managed Care – PPO | Admitting: Cardiology

## 2022-05-25 ENCOUNTER — Encounter: Payer: Self-pay | Admitting: Cardiology

## 2022-05-25 VITALS — BP 109/68 | HR 52 | Temp 98.0°F | Resp 17 | Ht 66.0 in | Wt 188.6 lb

## 2022-05-25 DIAGNOSIS — I493 Ventricular premature depolarization: Secondary | ICD-10-CM

## 2022-05-25 MED ORDER — METOPROLOL TARTRATE 50 MG PO TABS: 50.0000 mg | ORAL_TABLET | Freq: Two times a day (BID) | ORAL | 3 refills | Status: DC

## 2022-05-25 NOTE — Progress Notes (Signed)
Follow up visit  Subjective:   Pamela Grimes, female    DOB: January 23, 1961, 61 y.o.   MRN: [id number]   HPI  No chief complaint on file.   61 y.o. Caucasian female with h/o thyroid nodules, ADD, symptomatic PVC's  Patient is doing well with metoprolol, does not have any symptoms of palpitations.  Reviewed recent test results with the patient, details below.    Initial consultation HPI 01/2021: Patient has been having palpitations lasting for couple hours for last few months. Patient does have associated dyspnea with palpitations. Otherwise,e her mobility is limited due to orthopedic issues, but doe snot have any chest pain, exertional dyspnea.   Blood pressure elevated today, usually not this high. She drinks green tea, but does not drink coffee.   Current Outpatient Medications:    amphetamine-dextroamphetamine (ADDERALL) 20 MG tablet, Take 20 mg by mouth daily. HALF A TABLET, Disp: , Rfl:    Cholecalciferol (VITAMIN D PO), Take 1 tablet by mouth daily., Disp: , Rfl:    Cyanocobalamin (B-12 PO), Take 1 tablet by mouth daily., Disp: , Rfl:    DHEA 50 MG TABS, Take 25 mg by mouth daily., Disp: , Rfl:    escitalopram (LEXAPRO) 10 MG tablet, Take 10 mg by mouth daily., Disp: , Rfl:    Garlic 10 MG CAPS, Take 1 capsule by mouth daily., Disp: , Rfl:    HAWTHORN BERRY PO, Take 1 capsule by mouth daily., Disp: , Rfl:    Magnesium Citrate 200 MG TABS, Take 2 capsules by mouth daily., Disp: , Rfl:    Menaquinone-7 (VITAMIN K2) 100 MCG CAPS, Take 1 tablet by mouth daily., Disp: , Rfl:    metoprolol tartrate (LOPRESSOR) 50 MG tablet, TAKE 1 TABLET BY MOUTH TWICE A DAY, Disp: 180 tablet, Rfl: 1   NON FORMULARY, Take 2 capsules by mouth daily. Liver refresh, Disp: , Rfl:    Probiotic Product (PRO-BIOTIC BLEND PO), Take 1 capsule by mouth daily., Disp: , Rfl:    progesterone (PROMETRIUM) 200 MG capsule, Take 200 mg by mouth at bedtime., Disp: , Rfl:    thyroid (ARMOUR) 60 MG tablet, BREAK  ONE TABLET WITH TEETH AND DISSOLVE UNDER TONGUE IN AM DAILY, MAY EAT IN 15 MINUTES, Disp: , Rfl:   Cardiovascular & other pertient studies:  EKG 05/25/2022: Sinus rhythm 65 bpm Ventricular bigeminy  Diffuse low voltage Combined atrial enlargement Anterior infarct -age undetermined  Treadmill Exercise Stress 04/21/2021: The patient exercised for 6 minutes and 0 seconds on Bruce protocol; achieved 7.05 METs at 96% of maximum predicted heart rate.  Chest pain is not present.  Premature ventricular contractions in bigeminal pattern noted on ECG at rest, resolved at peak exercise. Premature ventricular contractions, V-couplets and V-Bigeminy noted on ECG in recovery.  No ST-T changes of ischemia.  The heart rate response was normal.  The blood pressure response was normal. Exercise capacity reduced.   Echocardiogram 04/21/2021:  Left ventricle cavity is normal in size. Normal left ventricular wall  thickness. Normal global wall motion. Normal diastolic filling pattern.  Frequent PVC noted during examination.  LV systolic function is lower  limit of normal, EF 50%. Calculated EF 55%.  Structurally normal tricuspid valve with trace regurgitation. No evidence  of pulmonary hypertension.  No significant change from 09/26/2018. Consider LV strain study to improve  accuracy of systolic function evaluation if clinically indicated.  EKG 02/16/2021: Sinus rhythm with ventricular bigeminy Combined atrial enlargement Anterior infarct -age undetermined  Mobile cardiac telemetry  13 days 12/30/2020 - 01/13/2021: Dominant rhythm: Sinus. HR 49-131 bpm. Avg HR 81 bpm, while in sinus rhythm. 2 episodes of SVT, fastest and longest at 156 bpm for 11 beats <1% isolated SVE, couplet 118 episodes of NSVT, fastest at 179 bpm for 4 beats, longest for 9 beats at 134 bpm. 23.5% isolated VE including ventricular bigeminy and trigeminy <1% couplet/triplets. No atrial fibrillation/atrial flutter//high grade AV  block, sinus pause >3sec noted. 17 patient triggered events correlate with ventricular ectopy.    Recent labs: Not available   Review of Systems  Cardiovascular:  Negative for chest pain, dyspnea on exertion, leg swelling, palpitations and syncope.  Respiratory:  Negative for shortness of breath.          There were no vitals filed for this visit.    There is no height or weight on file to calculate BMI. There were no vitals filed for this visit.    Objective:   Physical Exam Vitals and nursing note reviewed.  Constitutional:      General: She is not in acute distress. Neck:     Vascular: No JVD.  Cardiovascular:     Rate and Rhythm: Normal rate and regular rhythm. Frequent Extrasystoles are present.    Heart sounds: Normal heart sounds. No murmur heard. Pulmonary:     Effort: Pulmonary effort is normal.     Breath sounds: Normal breath sounds. No wheezing or rales.  Musculoskeletal:     Right lower leg: No edema.     Left lower leg: No edema.           Assessment & Recommendations:   61 y.o. Caucasian female with h/o thyroid nodules, ADD, symptomatic PVC's  Symptomatic PVC's: She continues to have frequent PVCs with ventricular bigeminy on EKG today.  However, she is asymptomatic on Metroprolol tartrate 50 mg bid. EF low normal, no symptoms of heart failure, Stress testing with no significant ischemic changes on EKG.  In absence of symptoms and above findings, she would like to continue metoprolol.  In future, if there is any increase in symptoms or drop in EF, consider referral to EP.  F/u in 6 months    Elder Negus, MD Pager: [phone] Office: [phone]

## 2022-06-20 DIAGNOSIS — F332 Major depressive disorder, recurrent severe without psychotic features: Secondary | ICD-10-CM | POA: Diagnosis not present

## 2022-09-04 DIAGNOSIS — F332 Major depressive disorder, recurrent severe without psychotic features: Secondary | ICD-10-CM | POA: Diagnosis not present

## 2022-11-23 ENCOUNTER — Ambulatory Visit: Payer: BC Managed Care – PPO | Admitting: Cardiology

## 2022-11-27 DIAGNOSIS — F332 Major depressive disorder, recurrent severe without psychotic features: Secondary | ICD-10-CM | POA: Diagnosis not present

## 2023-02-18 DIAGNOSIS — F332 Major depressive disorder, recurrent severe without psychotic features: Secondary | ICD-10-CM | POA: Diagnosis not present

## 2023-05-13 DIAGNOSIS — F332 Major depressive disorder, recurrent severe without psychotic features: Secondary | ICD-10-CM | POA: Diagnosis not present

## 2023-05-28 ENCOUNTER — Other Ambulatory Visit: Payer: Self-pay | Admitting: Cardiology

## 2023-05-28 DIAGNOSIS — I493 Ventricular premature depolarization: Secondary | ICD-10-CM

## 2023-06-05 ENCOUNTER — Other Ambulatory Visit: Payer: Self-pay | Admitting: Cardiology

## 2023-06-05 DIAGNOSIS — I493 Ventricular premature depolarization: Secondary | ICD-10-CM

## 2023-08-01 ENCOUNTER — Other Ambulatory Visit: Payer: Self-pay | Admitting: Cardiology

## 2023-08-01 DIAGNOSIS — I493 Ventricular premature depolarization: Secondary | ICD-10-CM

## 2024-06-26 ENCOUNTER — Encounter: Payer: Self-pay | Admitting: Advanced Practice Midwife
